# Patient Record
Sex: Female | Born: 1945 | Race: White | Hispanic: No | Marital: Married | Smoking: Never smoker
Health system: Southern US, Community
[De-identification: ages and names within clinical notes are randomized; demographics above are authoritative.]

## PROBLEM LIST (undated history)

## (undated) DIAGNOSIS — N840 Polyp of corpus uteri: Secondary | ICD-10-CM

## (undated) DIAGNOSIS — M1990 Unspecified osteoarthritis, unspecified site: Secondary | ICD-10-CM

## (undated) DIAGNOSIS — H269 Unspecified cataract: Secondary | ICD-10-CM

## (undated) DIAGNOSIS — Z87442 Personal history of urinary calculi: Secondary | ICD-10-CM

## (undated) DIAGNOSIS — I10 Essential (primary) hypertension: Secondary | ICD-10-CM

## (undated) DIAGNOSIS — K5792 Diverticulitis of intestine, part unspecified, without perforation or abscess without bleeding: Secondary | ICD-10-CM

## (undated) DIAGNOSIS — T148XXA Other injury of unspecified body region, initial encounter: Secondary | ICD-10-CM

## (undated) DIAGNOSIS — Z86018 Personal history of other benign neoplasm: Secondary | ICD-10-CM

## (undated) DIAGNOSIS — B029 Zoster without complications: Secondary | ICD-10-CM

## (undated) DIAGNOSIS — D219 Benign neoplasm of connective and other soft tissue, unspecified: Secondary | ICD-10-CM

## (undated) DIAGNOSIS — Z8619 Personal history of other infectious and parasitic diseases: Secondary | ICD-10-CM

## (undated) DIAGNOSIS — I82409 Acute embolism and thrombosis of unspecified deep veins of unspecified lower extremity: Secondary | ICD-10-CM

## (undated) DIAGNOSIS — J984 Other disorders of lung: Secondary | ICD-10-CM

## (undated) DIAGNOSIS — D249 Benign neoplasm of unspecified breast: Secondary | ICD-10-CM

## (undated) HISTORY — DX: Polyp of corpus uteri: N84.0

## (undated) HISTORY — PX: DILATION AND CURETTAGE OF UTERUS: SHX78

## (undated) HISTORY — DX: Personal history of other benign neoplasm: Z86.018

## (undated) HISTORY — DX: Other disorders of lung: J98.4

## (undated) HISTORY — DX: Unspecified osteoarthritis, unspecified site: M19.90

## (undated) HISTORY — DX: Acute embolism and thrombosis of unspecified deep veins of unspecified lower extremity: I82.409

## (undated) HISTORY — PX: CATARACT EXTRACTION W/ INTRAOCULAR LENS IMPLANT: SHX1309

## (undated) HISTORY — DX: Zoster without complications: B02.9

## (undated) HISTORY — DX: Benign neoplasm of connective and other soft tissue, unspecified: D21.9

## (undated) HISTORY — DX: Unspecified cataract: H26.9

## (undated) HISTORY — PX: OTHER SURGICAL HISTORY: SHX169

## (undated) HISTORY — DX: Benign neoplasm of unspecified breast: D24.9

## (undated) HISTORY — DX: Diverticulitis of intestine, part unspecified, without perforation or abscess without bleeding: K57.92

## (undated) HISTORY — DX: Personal history of other infectious and parasitic diseases: Z86.19

## (undated) HISTORY — DX: Other injury of unspecified body region, initial encounter: T14.8XXA

## (undated) HISTORY — PX: HAND SURGERY: SHX662

## (undated) HISTORY — DX: Essential (primary) hypertension: I10

---

## 1981-11-05 HISTORY — PX: TUBAL LIGATION: SHX77

## 1981-11-05 HISTORY — PX: CYSTECTOMY: SHX5119

## 1981-11-05 HISTORY — PX: APPENDECTOMY: SHX54

## 1988-11-05 DIAGNOSIS — Z8619 Personal history of other infectious and parasitic diseases: Secondary | ICD-10-CM

## 1988-11-05 HISTORY — DX: Personal history of other infectious and parasitic diseases: Z86.19

## 2005-11-01 ENCOUNTER — Encounter: Admission: RE | Admit: 2005-11-01 | Discharge: 2005-11-01 | Payer: Self-pay | Admitting: Radiology

## 2006-05-03 ENCOUNTER — Encounter: Admission: RE | Admit: 2006-05-03 | Discharge: 2006-05-03 | Payer: Self-pay | Admitting: Radiology

## 2006-10-16 ENCOUNTER — Encounter: Admission: RE | Admit: 2006-10-16 | Discharge: 2006-10-16 | Payer: Self-pay | Admitting: Radiology

## 2006-11-05 DIAGNOSIS — K5792 Diverticulitis of intestine, part unspecified, without perforation or abscess without bleeding: Secondary | ICD-10-CM

## 2006-11-05 HISTORY — DX: Diverticulitis of intestine, part unspecified, without perforation or abscess without bleeding: K57.92

## 2007-10-24 ENCOUNTER — Encounter: Admission: RE | Admit: 2007-10-24 | Discharge: 2007-10-24 | Payer: Self-pay | Admitting: Radiology

## 2008-03-22 ENCOUNTER — Ambulatory Visit (HOSPITAL_BASED_OUTPATIENT_CLINIC_OR_DEPARTMENT_OTHER): Admission: RE | Admit: 2008-03-22 | Discharge: 2008-03-22 | Payer: Self-pay | Admitting: Obstetrics and Gynecology

## 2008-03-22 ENCOUNTER — Encounter: Payer: Self-pay | Admitting: Obstetrics and Gynecology

## 2008-11-05 DIAGNOSIS — D249 Benign neoplasm of unspecified breast: Secondary | ICD-10-CM

## 2008-11-05 HISTORY — DX: Benign neoplasm of unspecified breast: D24.9

## 2008-11-05 HISTORY — PX: BREAST SURGERY: SHX581

## 2008-11-29 ENCOUNTER — Encounter (INDEPENDENT_AMBULATORY_CARE_PROVIDER_SITE_OTHER): Payer: Self-pay | Admitting: Surgery

## 2008-11-29 ENCOUNTER — Ambulatory Visit (HOSPITAL_BASED_OUTPATIENT_CLINIC_OR_DEPARTMENT_OTHER): Admission: RE | Admit: 2008-11-29 | Discharge: 2008-11-29 | Payer: Self-pay | Admitting: Surgery

## 2008-12-21 ENCOUNTER — Ambulatory Visit: Payer: Self-pay | Admitting: Genetic Counselor

## 2009-02-10 ENCOUNTER — Ambulatory Visit (HOSPITAL_BASED_OUTPATIENT_CLINIC_OR_DEPARTMENT_OTHER): Admission: RE | Admit: 2009-02-10 | Discharge: 2009-02-10 | Payer: Self-pay | Admitting: Surgery

## 2009-02-10 ENCOUNTER — Encounter (INDEPENDENT_AMBULATORY_CARE_PROVIDER_SITE_OTHER): Payer: Self-pay | Admitting: Surgery

## 2009-02-24 ENCOUNTER — Other Ambulatory Visit: Admission: RE | Admit: 2009-02-24 | Discharge: 2009-02-24 | Payer: Self-pay | Admitting: Obstetrics & Gynecology

## 2010-11-25 ENCOUNTER — Encounter: Payer: Self-pay | Admitting: Radiology

## 2010-12-01 ENCOUNTER — Other Ambulatory Visit: Payer: Self-pay | Admitting: Radiology

## 2010-12-01 DIAGNOSIS — Z803 Family history of malignant neoplasm of breast: Secondary | ICD-10-CM

## 2010-12-15 ENCOUNTER — Ambulatory Visit
Admission: RE | Admit: 2010-12-15 | Discharge: 2010-12-15 | Disposition: A | Discharge status: Home / Self Care | Payer: Managed Care, Other (non HMO) | Source: Ambulatory Visit | Attending: Radiology | Admitting: Radiology

## 2010-12-15 DIAGNOSIS — Z803 Family history of malignant neoplasm of breast: Secondary | ICD-10-CM

## 2010-12-15 MED ORDER — GADOBENATE DIMEGLUMINE 529 MG/ML IV SOLN
14.0000 mL | Freq: Once | INTRAVENOUS | Status: AC | PRN
  Administered 2010-12-15: 14 mL via INTRAVENOUS

## 2011-02-14 LAB — POCT HEMOGLOBIN-HEMACUE: Hemoglobin: 13.1 g/dL (ref 12.0–15.0)

## 2011-02-14 LAB — BASIC METABOLIC PANEL
BUN: 9 mg/dL (ref 6–23)
Calcium: 9.2 mg/dL (ref 8.4–10.5)
Chloride: 102 mEq/L (ref 96–112)
Creatinine, Ser: 0.84 mg/dL (ref 0.4–1.2)
GFR calc Af Amer: >60 mL/min (ref >60)
GFR calc non Af Amer: >60 mL/min (ref >60)

## 2011-02-19 LAB — BASIC METABOLIC PANEL
BUN: 14 mg/dL (ref 6–23)
Calcium: 9.5 mg/dL (ref 8.4–10.5)
Chloride: 104 mEq/L (ref 96–112)
Creatinine, Ser: 0.84 mg/dL (ref 0.4–1.2)
GFR calc Af Amer: >60 mL/min (ref >60)
GFR calc non Af Amer: >60 mL/min (ref >60)

## 2011-02-19 LAB — POCT HEMOGLOBIN-HEMACUE: Hemoglobin: 15 g/dL (ref 12.0–15.0)

## 2011-03-20 NOTE — Op Note (Signed)
NAME:  Marie Norris, Marie Norris                 ACCOUNT NO.:  [id number]   MEDICAL RECORD NO.:  [id number]          PATIENT TYPE:  AMB   LOCATION:  [facility]                         FACILITY:  [facility]   PHYSICIAN:  Cynthia P. Romine, M.D.DATE OF BIRTH:  22-Jun-1946   DATE OF PROCEDURE:  03/22/2008  DATE OF DISCHARGE:                               OPERATIVE REPORT   PREOPERATIVE DIAGNOSIS:  Postmenopausal bleeding, known endometrial  polyp on sonohysterogram.   POSTOPERATIVE DIAGNOSIS:  Postmenopausal bleeding, known endometrial  polyp on sonohysterogram,. pathology pending.   PROCEDURE:  Hysteroscopic resection of endometrial polyp, D&C.   SURGEON:  Dr. Arline Asp Romine.   ANESTHESIA:  General by LMA.   ESTIMATED BLOOD LOSS:  Minimal.   COMPLICATIONS:  None.   DESCRIPTION OF PROCEDURE:  The patient was taken to the operating room  and, after induction of adequate general anesthesia by LMA, was prepped  and draped in the usual fashion and the bladder drained with a red  rubber catheter.  A posterior weighted and anterior Sims retractor were  placed.  The cervix was grasped on its anterior lip with a single-  toothed tenaculum.  The cervix was then dilated to #31 Shawnie Pons.  The  operative hysteroscope was introduced.  Sorbitol was used as the  distention medium.  Hysteroscopy revealed there to be a long thin polyp  emanating from the fundus as was seen on sonohysterogram.  Single loop  cautery was used to remove the polyp which came out in a single piece.  Hysteroscopy after resection of the polyp revealed the cavity to be  otherwise clean and the polyp to be completely remove.  The hysteroscope  was removed.  Gentle sharp curettage was carried out, and the specimen  was sent to pathology.  Paracervical block was then instituted for  postoperative pain relief with 10 mL of 1% Xylocaine at each of 3 and 9  o'clock.  The instruments were removed from the vagina and the procedure  was terminated.  Sponge  and instrument counts were correct x3.      Cynthia P. Romine, M.D.  Electronically Signed     CPR/MEDQ  D:  03/22/2008  T:  03/22/2008  Job:  [id number]   cc:   Aram Beecham P. Romine, M.D.  Fax: [phone]

## 2011-03-20 NOTE — Op Note (Signed)
NAMEAYALA, RIBBLE                 ACCOUNT NO.:  [id number]   MEDICAL RECORD NO.:  [id number]          PATIENT TYPE:  AMB   LOCATION:  [facility]                          FACILITY:  [facility]   PHYSICIAN:  Thornton Park. Daphine Deutscher, MD  DATE OF BIRTH:  [birth year]   DATE OF PROCEDURE:  11/29/2008  DATE OF DISCHARGE:                               OPERATIVE REPORT   PREOPERATIVE DIAGNOSIS:  Left breast discharge.   POSTOPERATIVE DIAGNOSIS:  Left breast discharge.   PROCEDURE:  Cannulation of left breast ductule, loops magnification-  assisted left breast biopsy excising the ductule complex in the left  retroareolar region.   SURGEON:  Thornton Park. Daphine Deutscher, MD   ANESTHESIA:  General by LMA.   DESCRIPTION OF PROCEDURE:  Marie Norris is a 66 year old lady with  above-mentioned problem.  She was taken to room 1 and the breast was  prepped with Betadine and draped sterilely.  Previously, we had marked  the correct breast.  Both she and I had mapped the nipple in the office  where the slightly to the medial inferior center point of the nipple,  the duct that appeared to be draining the material.  With a probe that I  have passed into that duct, I went ahead and made an infra-areolar  incision and then moved up beneath the nipple.  I had to sacrifice a  couple of ducts to get to the duct in question, then removed the complex  of those ductules in the retroareolar region.  After I transected the  ducts from the nipple complex up past, other probes into the use a  larger sacs and resected this in toto.  I sent this for permanent  section.  The depths of the biopsy was a little areas a little more firm  I went ahead and sampled that and sent that as a second specimen which  was actually inferomedial to the biopsy site.  The area was irrigated  and bleeding was controlled with electrocautery.  Nothing appeared to be  bleeding and I approximated it deeply with a 4-0 Vicryl and then  subcutaneously and  subcuticularly with 4-0 Vicryl with Benzoin and Steri-  Strips on the skin.  The patient seemed tolerated the procedure well.  She was taken to recovery room in satisfactory condition.  She would be  given Tylox (30) for pain.  She should use ice packs and we will see her  back in the office in 1-2 weeks.      Thornton Park Daphine Deutscher, MD  Electronically Signed     MBM/MEDQ  D:  11/29/2008  T:  11/29/2008  Job:  [id number]   cc:   [employer]. Yolanda Bonine, M.D.

## 2011-03-20 NOTE — Op Note (Signed)
Marie Norris, Marie Norris                 ACCOUNT NO.:  [id number]   MEDICAL RECORD NO.:  [id number]          PATIENT TYPE:  AMB   LOCATION:  [facility]                          FACILITY:  [facility]   PHYSICIAN:  Thornton Park. Daphine Deutscher, MD  DATE OF BIRTH:  26-Mar-1946   DATE OF PROCEDURE:  DATE OF DISCHARGE:                               OPERATIVE REPORT   PREOPERATIVE DIAGNOSIS:  Previous breast biopsy on November 29, 2008,  revealed the deep margin showing a radial scar with some ductal  epithelial hyperplasia.  The patient has as strong family history of  breast cancer and cancer of the colon, and despite a BRCA study which  was negative, she wanted to go back to have more conservative biopsies.   PROCEDURE:  Left breast biopsy with 4 discrete biopsies taken radially  from the 9 o'clock position over to about 4 o'clock position beneath the  left nipple complex.   SURGEON:  Thornton Park. Daphine Deutscher, MD   ANESTHESIA:  General by LMA.   DESCRIPTION OF PROCEDURE:  Marie Norris was taken to room 6 on February 10, 2009, and given general anesthesia.  The breast was prepped with  chlorhexidine equivalent and draped sterilely.  I excised her previous  infra-areolar incision.  I then elevated the left nipple and stayed down  in a plane well below it.  I encountered some of the previous scar, I  used that as a guide and then went down and palpated anything that felt  little bit unusual.  As I worked over in the 4 o'clock position which  had been the lateral margin, I found a sort of old cystic structures  that appeared to contain some old blood and excised those.  Anything  that felt firm or anyway potentially suspicious, I biopsied, and again a  total of 4 biopsies were sent discretely and enumerated by the clock.  When these were complete, everything was relatively smooth.  I  cauterized the depth of the biopsy site with Bovie, injected it with  some 0.5% Marcaine.  The wound was then closed in layers of 4-0 and 5-0  Vicryls with Benzoin and Steri-Strips on the skin.  The patient  tolerated the procedure well.  This was done with a latex-free setup  because of some potential allergy on her part.      Thornton Park Daphine Deutscher, MD  Electronically Signed     MBM/MEDQ  D:  02/10/2009  T:  02/10/2009  Job:  [id number]   cc:   Marie Norris  Marie Ruths, MD

## 2011-08-01 LAB — DIFFERENTIAL
Basophils Relative: 0
Eosinophils Absolute: 0.1
Lymphs Abs: 2.4
Monocytes Relative: 6
Neutro Abs: 3.7
Neutrophils Relative %: 56

## 2011-08-01 LAB — CBC
MCV: 89.4
Platelets: 203
RBC: 4.64
WBC: 6.6

## 2011-08-01 LAB — BASIC METABOLIC PANEL
BUN: 11
Calcium: 9.5
Chloride: 101
Creatinine, Ser: 0.85
GFR calc Af Amer: >60

## 2012-03-07 LAB — HM PAP SMEAR: HM Pap smear: NEGATIVE

## 2012-11-17 LAB — HM MAMMOGRAPHY: HM Mammogram: 0

## 2013-01-03 DIAGNOSIS — H269 Unspecified cataract: Secondary | ICD-10-CM

## 2013-01-03 HISTORY — DX: Unspecified cataract: H26.9

## 2013-03-12 ENCOUNTER — Encounter: Payer: Self-pay | Admitting: Certified Nurse Midwife

## 2013-03-13 ENCOUNTER — Ambulatory Visit (INDEPENDENT_AMBULATORY_CARE_PROVIDER_SITE_OTHER): Admitting: Certified Nurse Midwife

## 2013-03-13 ENCOUNTER — Encounter: Payer: Self-pay | Admitting: Certified Nurse Midwife

## 2013-03-13 VITALS — BP 110/62 | Ht 60.25 in | Wt 151.0 lb

## 2013-03-13 DIAGNOSIS — Z01419 Encounter for gynecological examination (general) (routine) without abnormal findings: Secondary | ICD-10-CM

## 2013-03-13 DIAGNOSIS — N952 Postmenopausal atrophic vaginitis: Secondary | ICD-10-CM

## 2013-03-13 NOTE — Progress Notes (Addendum)
67 y.o. G64P2002 Married Caucasian Fe here for annual exam. Menopausal no vaginal bleeding, using Premarin cream 3 x monthly for dryness, works well for her. Spouse has ED, so sexually active less(no issues). Sad due to mom's death, and 59 year old  Granddaughter with Chron's is living with her, indefinite time frame. Hypertension stable on medication. Continues Metformin for glucose control, doing well.  Sees PCP every 6 months.  Under follow up with mammogram due to previous history of adenoma.  No new health issues today,     Patient's last menstrual period was 06/18/2004.          Sexually active: yes  The current method of family planning is tubal ligation.    Exercising: no  exercise Smoker:  no  Health Maintenance: Pap:  03-07-12 neg HPV HR neg MMG:  11-17-12 Colonoscopy:  2013 BMD:   2008 TDaP:  3/09  Labs: none   reports that she has never smoked. She has never used smokeless tobacco. She reports that she does not drink alcohol or use illicit drugs.  Past Medical History  Diagnosis Date  . Hypertension   . DVT (deep venous thrombosis)     oral contraceptive use  . History of uterine fibroid   . Diverticulitis 2008  . History of [place] spotted fever 1990  . Kidney stones 1974  . Breast adenoma 2010    benign  . 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 Torn ligament     left ankle    Past Surgical History  Procedure Laterality Date  . Tubal ligation Bilateral 1983  . Appendectomy  1983  . Cystectomy Right 1983    right ovary  . Breast surgery Left 2010    adenoma removal, biopsy, tissue removal; benign  . Polyp removal      Current Outpatient Prescriptions  Medication Sig Dispense Refill  . azelastine (OPTIVAR) 0.05 % ophthalmic solution 1 drop 2 (two) times daily as needed.      . Calcium Carbonate-Vitamin D (CALCIUM 600 + D PO) Take by mouth daily.       Marland Kitchen conjugated estrogens (PREMARIN) vaginal cream Place vaginally as needed.        . diclofenac sodium (VOLTAREN) 1 % GEL Apply topically daily.       . fluticasone (FLONASE) 50 MCG/ACT nasal spray Place 2 sprays into the nose daily as needed for rhinitis.      . furosemide (LASIX) 20 MG tablet Take 20 mg by mouth daily.      Marland Kitchen ibuprofen (ADVIL,MOTRIN) 200 MG tablet Take 200 mg by mouth every 6 (six) hours as needed for pain.      Marland Kitchen losartan (COZAAR) 100 MG tablet Take 100 mg by mouth daily.      . metFORMIN (GLUCOPHAGE) 500 MG tablet Take 500 mg by mouth daily.       . Misc Natural Products (OSTEO BI-FLEX JOINT SHIELD PO) Take by mouth daily. With glucosamine & vitamin d3      . Multiple Vitamin (MULTIVITAMIN) tablet Take 1 tablet by mouth daily.      . OMEGA 3 1200 MG CAPS Take by mouth 2 (two) times daily.      . Red Yeast Rice 600 MG TABS Take by mouth daily.       No current facility-administered medications for this visit.    Family History  Problem Relation Age of Onset  . COPD Mother     stage  4  . Hypertension Mother 42  . Mitral valve prolapse Mother 43  . Crohn's disease Grandchild     granddaughter  . Mitral valve prolapse Sister 30  . Breast cancer Sister   . Colon cancer Father   . Cancer Paternal Aunt     bladder  . Evelene Croon Parkinson White syndrome Paternal Uncle   . Breast cancer Maternal Grandmother   . Evelene Croon Parkinson White syndrome Paternal Grandfather     ?  Marland Kitchen Evelene Croon Parkinson White syndrome Cousin   . Evelene Croon Parkinson White syndrome Cousin   . Cancer Paternal Aunt     melanoma  . Cancer Paternal Uncle     blood cancer    ROS:  Pertinent items are noted in HPI.  Otherwise, a comprehensive ROS was negative.  Exam:   BP 110/62  Ht 5' 0.25" (1.53 m)  Wt 151 lb (68.493 kg)  BMI 29.26 kg/m2  LMP 06/18/2004 Height: 5' 0.25" (153 cm)  Ht Readings from Last 3 Encounters:  03/13/13 5' 0.25" (1.53 m)    General appearance: alert, cooperative and appears stated age, crying sharing mother passing with family history change Head:  Normocephalic, without obvious abnormality, atraumatic Neck: no adenopathy, supple, symmetrical, trachea midline and thyroid normal to inspection and palpation Lungs: clear to auscultation bilaterally Breasts: normal appearance, no masses or tenderness, No nipple retraction or dimpling, No nipple discharge or bleeding, No axillary or supraclavicular adenopathy Heart: regular rate and rhythm Abdomen: soft, non-tender; no masses,  no organomegaly Extremities: extremities normal, atraumatic, no cyanosis or edema Skin: Skin color, texture, turgor normal. No rashes or lesions Lymph nodes: Cervical, supraclavicular, and axillary nodes normal. No abnormal inguinal nodes palpated Neurologic: Grossly normal   Pelvic: External genitalia:  no lesions              Urethra:  normal appearing urethra with no masses, tenderness or lesions              Bartholin's and Skene's: normal                 Vagina:atrophic appearing vagina with normal color and discharge, no lesions, moist              Cervix: normal, non tender              Pap taken: no Bimanual Exam:  Uterus:  normal size, contour, position, consistency, mobility, non-tender and mid position              Adnexa: normal adnexa and no mass, fullness, tenderness               Rectovaginal: Confirms               Anus:  normal sphincter tone, no lesions  A:  Well Woman with normal exam  Menopausal, no HRT  Atrophic vaginitis uses Premarin cream with good success  Hypertension stable on medication  Diabetes stable on Metformin  Social Stress with family  P:  Reviewed health and wellness pertinent to exam   Pap smear as per guidelines   Mammogram as recommended with follow up  Rx Premarin Cream see order  Pap smear not taken today counseled on mammography screening, adequate intake of calcium and vitamin D, diet and exercise return annually or prn  Continue MD follow up with medical issues  Discussed time for self with family issues,  seek friends and church support. Discuss with PCP if feels she needs help with anti depressive medication.  Patient prefers PCP to work with if needed. Offered my sympathy in loss of Mom.  Rv annually or prn  An After Visit Summary was printed and given to the patient. Reviewed, TL

## 2013-03-13 NOTE — Patient Instructions (Signed)

## 2013-03-23 ENCOUNTER — Telehealth: Payer: Self-pay | Admitting: Certified Nurse Midwife

## 2013-03-23 NOTE — Telephone Encounter (Signed)
Routed to triage 

## 2013-03-23 NOTE — Telephone Encounter (Signed)
Patient requests a written prescription, but wants to speak to nurse more in depth about her questions pertaining.

## 2013-03-26 ENCOUNTER — Telehealth: Payer: Self-pay | Admitting: Certified Nurse Midwife

## 2013-03-26 MED ORDER — ESTROGENS, CONJUGATED 0.625 MG/GM VA CREA: TOPICAL_CREAM | VAGINAL | Status: DC | PRN

## 2013-03-26 NOTE — Telephone Encounter (Signed)
Patient request a prescription mailed to her home for premarin. Patient will mail to her mail order pharmacy. Please call patient first.

## 2013-03-26 NOTE — Telephone Encounter (Signed)
Patient seen 03/13/13 and was not given her script for Premarin ok to fill for one year? She is asking it be mailed to her.

## 2013-03-26 NOTE — Addendum Note (Signed)
Addended by: Verner Chol on: 03/26/2013 04:01 PM   Modules accepted: Orders

## 2013-03-26 NOTE — Telephone Encounter (Signed)
Patient needs a written premarin prescription mailed to her home. Patient will mail to her mail order pharmacy. Please call patient when this mailed out to her.

## 2013-03-27 NOTE — Telephone Encounter (Signed)
Pt wants to speak with Amy in regards to written consent for premarin prescription.

## 2013-03-27 NOTE — Telephone Encounter (Signed)
Patient reports she has [facility] and they do not have electronic service. Requests hard copy be mailed to her.

## 2013-03-27 NOTE — Telephone Encounter (Signed)
LMTCB about mail order pharmacy. Advised we can send in Rx electronically and save her the trouble.

## 2013-05-14 ENCOUNTER — Encounter: Payer: Self-pay | Admitting: Certified Nurse Midwife

## 2013-05-14 ENCOUNTER — Ambulatory Visit (INDEPENDENT_AMBULATORY_CARE_PROVIDER_SITE_OTHER): Admitting: Certified Nurse Midwife

## 2013-05-14 VITALS — BP 108/62 | HR 68 | Resp 16 | Ht 60.0 in | Wt 151.0 lb

## 2013-05-14 DIAGNOSIS — N950 Postmenopausal bleeding: Secondary | ICD-10-CM

## 2013-05-14 DIAGNOSIS — R9389 Abnormal findings on diagnostic imaging of other specified body structures: Secondary | ICD-10-CM

## 2013-05-14 DIAGNOSIS — D259 Leiomyoma of uterus, unspecified: Secondary | ICD-10-CM

## 2013-05-14 NOTE — Progress Notes (Signed)
67 yo married white female G2 P2 here complaining of post-menopausal bleeding in the past 24 hours. Felt inside vagina to cervix and noted pink to red discharge from cervix. Started with brown discharge yesterday and continued to occur, noting on panties and then continued to notice red - pink until late pm. None noted this am. History of fibroid 1983, endometrial polyp with hysteroscopic resection polyp , D&C with Dr. Tresa Res 2009. No sexually activity in the past two weeks. Denies vaginal itching, burning or discharge or vaginal dryness. Denies pelvic or abdominal pain. Also being worked up for 3rd finger on right hand for surgery.Recent aex 03/07/13 normal, last pap was 03/07/12 negative with negative HPVHR. Patient has surgery for hand scheduled on July 29,2014.   O: Healthy female WDWN Affect: normal orientation x 3  Exam: Skin: warm and dry Abdomen: soft, non tender Pelvic: External genital area: no lesions BUS: negative Vagina: moist normal vaginal discharge noted, no blood noted Cervix:parous, non tender, with pink discharge from cervix noted x 2, no lesions or polyp type tissue noted in visual area of cervix, negative CMT Uterus:Mid position, non tender, normal size Adnexa: normal, non tender, no masses palpated Perineal area: no lesions  A:Post menopausal bleeding, history of endometrial polyp with hysteroscopic resection and D&C 2009. 2-Right hand 3rd finger surgery scheduled 06-02-13  P: Discussed need for evaluation and noted coming from cervix not vagina. Discussed evaluation with PUS, ? SHGM, endometrial biopsy. Patient agreeable to any evaluation needed. Instructed to call if occurs again before evaluated. Patient agreeable. Will consult with MD 2-Patient would like evaluation before finger surgery if possible.  Rv prn and as above  Reviewed, TL

## 2013-05-21 ENCOUNTER — Telehealth: Payer: Self-pay | Admitting: Certified Nurse Midwife

## 2013-05-21 NOTE — Telephone Encounter (Signed)
Patient calling re: still waiting to hear about benefits to schedule ultrasound. Patient seemed anxious.

## 2013-05-26 NOTE — Telephone Encounter (Signed)
LMTCB on cell number to discuss insurance benefits for pus-shgm/endo bx.  LMTCB on home number to to discuss insurance benefits.

## 2013-05-28 ENCOUNTER — Other Ambulatory Visit: Payer: Self-pay | Admitting: Gynecology

## 2013-05-28 DIAGNOSIS — N950 Postmenopausal bleeding: Secondary | ICD-10-CM

## 2013-06-09 ENCOUNTER — Encounter: Payer: Self-pay | Admitting: Gynecology

## 2013-06-09 ENCOUNTER — Ambulatory Visit (INDEPENDENT_AMBULATORY_CARE_PROVIDER_SITE_OTHER): Admitting: Gynecology

## 2013-06-09 ENCOUNTER — Ambulatory Visit (INDEPENDENT_AMBULATORY_CARE_PROVIDER_SITE_OTHER)

## 2013-06-09 DIAGNOSIS — N950 Postmenopausal bleeding: Secondary | ICD-10-CM

## 2013-06-09 DIAGNOSIS — R9389 Abnormal findings on diagnostic imaging of other specified body structures: Secondary | ICD-10-CM

## 2013-06-09 DIAGNOSIS — D259 Leiomyoma of uterus, unspecified: Secondary | ICD-10-CM

## 2013-06-09 DIAGNOSIS — I10 Essential (primary) hypertension: Secondary | ICD-10-CM

## 2013-06-09 DIAGNOSIS — Z86718 Personal history of other venous thrombosis and embolism: Secondary | ICD-10-CM | POA: Insufficient documentation

## 2013-06-09 MED ORDER — MISOPROSTOL 200 MCG PO TABS: ORAL_TABLET | ORAL | Status: DC

## 2013-06-09 NOTE — Addendum Note (Signed)
Addended by: Lorraine Lax on: 06/09/2013 05:22 PM   Modules accepted: Orders

## 2013-06-09 NOTE — Progress Notes (Signed)
      Pt is here for u/s with SHG due to PMB that occurred 7/10.  Pt reports that she only bled one day after that encounter.  Pt is without concenrs today. U/S images were reviewed with the patient, she was noted to have an asymmetry in the endometrium. Stripe 2.2mm, ovaries consistent with menopausal changes.  Pt  Had had a D&C hysteroscopy in 2009 for an endometrial polyp. A sonohysterogram was performed.  A graves speculum was placed, the cervix was visualized and cleansed with Hibiclens. The insemination catheter was placed and walls gently distended.  A left cornual defect was noted. Images today were compared with the u/s images of 2009, similar location was noted. We recommend proceeding with a D&C Hysteroscopy to remove this cornual mass.    Pt informed regarding risks and benefits of surgery, including but not limited to bleeding, infections, damage to bowel or bladder due to uterine perforation either during the procedure or during dilation.  Cytotec will be given preoperatively to soften the cervix.    There is a risk of formation of a deep vein thrombus in the extremities was discussed, although PAS will be placed to minimize the risk, the patient was informed that a pulmonary embolism could still form and could result in death.  Pt had had a DVT on ocp in the past after her 2 pregnancies, of which she had had no complications, in addition she had had other surgeries for breast, ankle and hysteroscopy.  We will see if she had any evaluation for factor V or other coagulopathy, and determine if additional evaluation is warranted before proceeding She was instructed on signs and symptoms to be aware of and the need to call if they should develop.  Fluid overload from the distending media was discussed as was the usual intra-operative safety precautions in place.  All questions were addressed.    Length of additional time spent discussing treatment of PMB and DVT 40m 

## 2013-06-10 ENCOUNTER — Telehealth: Payer: Self-pay | Admitting: Gynecology

## 2013-06-10 NOTE — Telephone Encounter (Signed)
LMTCB on cell  to discuss ins benefits for surgery.

## 2013-06-11 NOTE — Telephone Encounter (Signed)
Pt returning call

## 2013-06-12 ENCOUNTER — Telehealth: Payer: Self-pay | Admitting: *Deleted

## 2013-06-12 NOTE — Telephone Encounter (Signed)
Patient calling you back about her surgery

## 2013-06-12 NOTE — Telephone Encounter (Signed)
Call to patient to check on date preferences for surgery. Female states patient not available, LMTCB, nothing wrong.

## 2013-06-15 ENCOUNTER — Telehealth: Payer: Self-pay | Admitting: Certified Nurse Midwife

## 2013-06-15 NOTE — Telephone Encounter (Signed)
Patient came by to make payment on surgery . Wanted to speak with you while she was here . Said she missed your call on Friday. She left word that when you called her back to call her after 1:30 that she would not be home until then.

## 2013-06-17 ENCOUNTER — Encounter (HOSPITAL_COMMUNITY): Payer: Self-pay | Admitting: Pharmacy Technician

## 2013-06-17 NOTE — Telephone Encounter (Signed)
LM on VM, per patient previous instructions.  Surgery scheduled for 06-24-13 and to call back for instructions, post op and to see if any previous labs done for previous DVT?

## 2013-06-18 ENCOUNTER — Encounter (HOSPITAL_COMMUNITY): Payer: Self-pay | Admitting: Pharmacy Technician

## 2013-06-18 NOTE — Telephone Encounter (Signed)
Call to patient, female states she "is outside". LMTCB

## 2013-06-18 NOTE — Telephone Encounter (Signed)
Call back to patient and notified that surgery is scheduled for 06-24-13 at 9 am at [facility]. Instructuions reviewed and mailed. Post op scheduled. Patient with two questions.  1) she really does not think any labs were done about previous DVT.  Dr Modesto Charon would have done them but patient feels certain not done. Should we do now?  2) has been off premarin cream since episode of bleeding.  She is feeling the discomfort from dryness, should she use cream tonight to improve this before surgery?

## 2013-06-18 NOTE — Telephone Encounter (Signed)
I went thru everything in Epic and I cannot find any lab, I  Will contact her PCP and OR in am

## 2013-06-19 NOTE — Telephone Encounter (Signed)
Patient notified that per Dr Farrel Gobble, she has consulted with Dr Myna Hidalgo Hematology and patients PCP Dr Martha Clan.  At this time, no other testing indicated prior to surgery.

## 2013-06-23 ENCOUNTER — Encounter (HOSPITAL_COMMUNITY): Payer: Self-pay

## 2013-06-23 ENCOUNTER — Other Ambulatory Visit: Payer: Self-pay

## 2013-06-23 ENCOUNTER — Encounter (HOSPITAL_COMMUNITY)
Admission: RE | Admit: 2013-06-23 | Discharge: 2013-06-23 | Disposition: A | Discharge status: Home / Self Care | Source: Ambulatory Visit | Attending: Gynecology | Admitting: Gynecology

## 2013-06-23 HISTORY — DX: Personal history of urinary calculi: Z87.442

## 2013-06-23 LAB — CBC
HCT: 42.9 % (ref 36.0–46.0)
MCHC: 35 g/dL (ref 30.0–36.0)
MCV: 89 fL (ref 78.0–100.0)
RDW: 13.1 % (ref 11.5–15.5)
WBC: 7.7 10*3/uL (ref 4.0–10.5)

## 2013-06-23 LAB — BASIC METABOLIC PANEL
BUN: 12 mg/dL (ref 6–23)
Chloride: 99 mEq/L (ref 96–112)
Creatinine, Ser: 0.91 mg/dL (ref 0.50–1.10)
GFR calc Af Amer: 76 mL/min — ABNORMAL LOW (ref >90)

## 2013-06-23 NOTE — Patient Instructions (Signed)
Your procedure is scheduled on: 06/24/13  Enter through the Main Entrance at :0730 Pick up desk phone and dial [phone] and inform us of your arrival.  Please call [phone] if you have any problems the morning of surgery.  Remember: Do not eat food or drink liquids, including water, after midnight:tonight   You may brush your teeth the morning of surgery.  Take these meds the morning of surgery with a sip of water: none-  HOLD METFORMIN FOR 24 HRS PRIOR TO SURGERY  DO NOT wear jewelry, eye make-up, lipstick,body lotion, or dark fingernail polish.  (Polished toes are ok) You may wear deodorant.  If you are to be admitted after surgery, leave suitcase in car until your room has been assigned. Patients discharged on the day of surgery will not be allowed to drive home. Wear loose fitting, comfortable clothes for your ride home.

## 2013-06-24 ENCOUNTER — Encounter (HOSPITAL_COMMUNITY)
Admission: RE | Disposition: A | Discharge status: Home / Self Care | Payer: Self-pay | Source: Ambulatory Visit | Attending: Gynecology

## 2013-06-24 ENCOUNTER — Encounter (HOSPITAL_COMMUNITY): Payer: Self-pay | Admitting: Registered Nurse

## 2013-06-24 ENCOUNTER — Ambulatory Visit (HOSPITAL_COMMUNITY)
Admission: RE | Admit: 2013-06-24 | Discharge: 2013-06-24 | Disposition: A | Discharge status: Home / Self Care | Source: Ambulatory Visit | Attending: Gynecology | Admitting: Gynecology

## 2013-06-24 ENCOUNTER — Ambulatory Visit (HOSPITAL_COMMUNITY): Admitting: Registered Nurse

## 2013-06-24 ENCOUNTER — Encounter (HOSPITAL_COMMUNITY): Payer: Self-pay | Admitting: *Deleted

## 2013-06-24 DIAGNOSIS — N950 Postmenopausal bleeding: Secondary | ICD-10-CM

## 2013-06-24 DIAGNOSIS — I10 Essential (primary) hypertension: Secondary | ICD-10-CM

## 2013-06-24 HISTORY — PX: DILATATION & CURETTAGE/HYSTEROSCOPY WITH TRUECLEAR: SHX6353

## 2013-06-24 SURGERY — DILATATION & CURETTAGE/HYSTEROSCOPY WITH TRUCLEAR
Anesthesia: General | Site: Uterus | Wound class: Clean Contaminated

## 2013-06-24 MED ORDER — BUPIVACAINE-EPINEPHRINE PF 0.25-1:200000 % IJ SOLN
INTRAMUSCULAR | Status: AC
  Filled 2013-06-24: qty 30

## 2013-06-24 MED ORDER — FENTANYL CITRATE 0.05 MG/ML IJ SOLN
INTRAMUSCULAR | Status: DC | PRN
  Administered 2013-06-24 (×2): 50 ug via INTRAVENOUS

## 2013-06-24 MED ORDER — LACTATED RINGERS IV SOLN
INTRAVENOUS | Status: DC
  Administered 2013-06-24 (×2): via INTRAVENOUS

## 2013-06-24 MED ORDER — LACTATED RINGERS IV SOLN: INTRAVENOUS | Status: DC

## 2013-06-24 MED ORDER — DEXAMETHASONE SODIUM PHOSPHATE 10 MG/ML IJ SOLN
INTRAMUSCULAR | Status: AC
  Filled 2013-06-24: qty 1

## 2013-06-24 MED ORDER — EPHEDRINE SULFATE 50 MG/ML IJ SOLN
INTRAMUSCULAR | Status: DC | PRN
  Administered 2013-06-24 (×2): 10 mg via INTRAVENOUS

## 2013-06-24 MED ORDER — ONDANSETRON HCL 4 MG/2ML IJ SOLN: 4.0000 mg | Freq: Once | INTRAMUSCULAR | Status: DC | PRN

## 2013-06-24 MED ORDER — ONDANSETRON HCL 4 MG/2ML IJ SOLN
INTRAMUSCULAR | Status: DC | PRN
  Administered 2013-06-24: 4 mg via INTRAVENOUS

## 2013-06-24 MED ORDER — PROPOFOL 10 MG/ML IV EMUL
INTRAVENOUS | Status: AC
  Filled 2013-06-24: qty 20

## 2013-06-24 MED ORDER — FENTANYL CITRATE 0.05 MG/ML IJ SOLN: 25.0000 ug | INTRAMUSCULAR | Status: DC | PRN

## 2013-06-24 MED ORDER — SODIUM CHLORIDE 0.9 % IR SOLN
Status: DC | PRN
  Administered 2013-06-24: 3000 mL via INTRAVESICAL

## 2013-06-24 MED ORDER — PROPOFOL 10 MG/ML IV BOLUS
INTRAVENOUS | Status: DC | PRN
  Administered 2013-06-24: 170 mg via INTRAVENOUS

## 2013-06-24 MED ORDER — LIDOCAINE HCL (CARDIAC) 20 MG/ML IV SOLN
INTRAVENOUS | Status: AC
  Filled 2013-06-24: qty 5

## 2013-06-24 MED ORDER — KETOROLAC TROMETHAMINE 30 MG/ML IJ SOLN
15.0000 mg | Freq: Once | INTRAMUSCULAR | Status: DC | PRN
Start: 2013-06-24 — End: 2013-06-24

## 2013-06-24 MED ORDER — MEPERIDINE HCL 25 MG/ML IJ SOLN: 6.2500 mg | INTRAMUSCULAR | Status: DC | PRN

## 2013-06-24 MED ORDER — LIDOCAINE HCL 2 % IJ SOLN
INTRAMUSCULAR | Status: DC | PRN
  Administered 2013-06-24: 15 mL

## 2013-06-24 MED ORDER — ONDANSETRON HCL 4 MG/2ML IJ SOLN
INTRAMUSCULAR | Status: AC
  Filled 2013-06-24: qty 2

## 2013-06-24 MED ORDER — EPHEDRINE 5 MG/ML INJ
INTRAVENOUS | Status: AC
  Filled 2013-06-24: qty 10

## 2013-06-24 MED ORDER — MIDAZOLAM HCL 5 MG/5ML IJ SOLN
INTRAMUSCULAR | Status: DC | PRN
  Administered 2013-06-24: 2 mg via INTRAVENOUS

## 2013-06-24 MED ORDER — DEXAMETHASONE SODIUM PHOSPHATE 10 MG/ML IJ SOLN
INTRAMUSCULAR | Status: DC | PRN
  Administered 2013-06-24: 10 mg via INTRAVENOUS

## 2013-06-24 MED ORDER — MIDAZOLAM HCL 2 MG/2ML IJ SOLN
INTRAMUSCULAR | Status: AC
  Filled 2013-06-24: qty 2

## 2013-06-24 MED ORDER — LIDOCAINE HCL (CARDIAC) 20 MG/ML IV SOLN
INTRAVENOUS | Status: DC | PRN
  Administered 2013-06-24: 50 mg via INTRAVENOUS

## 2013-06-24 MED ORDER — LIDOCAINE HCL 2 % IJ SOLN
INTRAMUSCULAR | Status: AC
  Filled 2013-06-24: qty 20

## 2013-06-24 MED ORDER — FENTANYL CITRATE 0.05 MG/ML IJ SOLN
INTRAMUSCULAR | Status: AC
  Filled 2013-06-24: qty 2

## 2013-06-24 SURGICAL SUPPLY — 22 items
BLADE INCISOR TRUC PLUS 2.9 (ABLATOR) ×1 IMPLANT
CANISTERS HI-FLOW 3000CC (CANNISTER) ×8 IMPLANT
CATH SILICONE 16FRX5CC (CATHETERS) ×2 IMPLANT
CONTAINER PREFILL 10% NBF 60ML (FORM) ×4 IMPLANT
DECANTER SPIKE VIAL GLASS SM (MISCELLANEOUS) ×2 IMPLANT
DRAPE HYSTEROSCOPY (DRAPE) ×2 IMPLANT
DRESSING TELFA 8X3 (GAUZE/BANDAGES/DRESSINGS) ×2 IMPLANT
GLOVE BIOGEL PI IND STRL 7.0 (GLOVE) ×2 IMPLANT
GLOVE BIOGEL PI INDICATOR 7.0 (GLOVE) ×2
GLOVE SURG SS PI 6.5 STRL IVOR (GLOVE) ×6 IMPLANT
GLOVE SURG SS PI 7.0 STRL IVOR (GLOVE) ×4 IMPLANT
GOWN STRL REIN XL XLG (GOWN DISPOSABLE) ×8 IMPLANT
INCISOR TRUC PLUS BLADE 2.9 (ABLATOR) ×2
KIT HYSTEROSCOPY TRUCLEAR (ABLATOR) ×2 IMPLANT
NEEDLE SPNL 22GX3.5 QUINCKE BK (NEEDLE) ×2 IMPLANT
PACK VAGINAL MINOR WOMEN LF (CUSTOM PROCEDURE TRAY) ×2 IMPLANT
PAD OB MATERNITY 4.3X12.25 (PERSONAL CARE ITEMS) ×4 IMPLANT
SYR BULB IRRIGATION 50ML (SYRINGE) ×2 IMPLANT
SYR CONTROL 10ML LL (SYRINGE) ×2 IMPLANT
SYRINGE 10CC LL (SYRINGE) ×2 IMPLANT
TOWEL OR 17X24 6PK STRL BLUE (TOWEL DISPOSABLE) ×6 IMPLANT
WATER STERILE IRR 1000ML POUR (IV SOLUTION) ×2 IMPLANT

## 2013-06-24 NOTE — Preoperative (Signed)
Beta Blockers   Reason not to administer Beta Blockers:Not Applicable 

## 2013-06-24 NOTE — Op Note (Signed)
[name], Marie Norris                 ACCOUNT NO.:  [id number]  MEDICAL RECORD NO.:  [id number]  LOCATION:  [facility]                          FACILITY:  [facility]  PHYSICIAN:  Ivor Costa. Farrel Gobble, M.D. DATE OF BIRTH:  12/23/1945  DATE OF PROCEDURE:  06/24/2013 DATE OF DISCHARGE:  06/24/2013                              OPERATIVE REPORT   PREOPERATIVE DIAGNOSES: 1. Postmenopausal bleeding. 2. Endometrial polyp.  POSTOPERATIVE DIAGNOSES: 1. Postmenopausal bleeding. 2. Endometrial polyp.  PROCEDURE:  Dilation and curettage hysteroscopy with TRUCLEAR.  SURGEON:  Ivor Costa. Farrel Gobble, M.D.  ANESTHESIA:  MAC.  IV FLUIDS:  A 1000 mL.  URINE OUTPUT:  200 mL.  I AND O DEFICIT OF NORMAL SALINE:  0.  INDICATIONS:  The patient is a 67 year old, postmenopausal woman, who presented for postmenopausal bleeding.  The patient was noted to have had a D and C hysteroscopy for the same indication back in 2009, where she had a polyp arising from the area of the left cornua. 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  FINDINGS:  The uterus sounded to 7.  The right cornua was visualized and unremarkable.  The left cornua was somewhat obscured with a flat sessile soft mass from the cornual area up to the mid portion anterior fundal. Remainder of the cavity was unremarkable as was the cervix.  DESCRIPTION OF PROCEDURE:  The patient was taken to the operating room, placed in the dorsal lithotomy position, prepped and draped in the usual sterile fashion.  Bimanual exam was performed.  The orientation of the uterus was confirmed.  The bivalve speculum was then placed in the vagina.  The cervix was visualized, stabilized with single-tooth tenaculum.  The uterus sounded to 7.  Cervix was felt to be dilated from the Cytotec given the night before.  A #17 dilator advanced with ease. The TRUCLEAR was then set and advanced through the cervix.  The  findings were as noted above.  The ostia on the right was crisp and able to be readily identified.  The ostia on the left seemed somewhat obscured, which was felt to be the area of this sessile mass, and was consistent with the area of her prior polyp.  The TRUCLEAR was then set into the locked position and then turned on, and in a sweeping fashion going around the ostial area and the fundal area.  The patient was able to be removed pulling the hysteroscope back, showed that the subtle defect was now no longer evident and the tubal ostia was now able to be seen. There was only minimal amount of tissue, however, that was obtained. The antrum was then removed.  A sharp curettage was performed, again for minimal tissue.  The instruments were then removed.  At the beginning of the case, the patient's cervix had been injected with 2% lidocaine for a total of 13 mL.  Lap, sponge, and needle counts were correct x2.  She was extubated in the OR, and transferred to the recovery room in stable condition.     Ivor Costa. Farrel Gobble, M.D.     THL/MEDQ  D:  06/24/2013  T:  06/24/2013  Job:  [id number]

## 2013-06-24 NOTE — H&P (View-Only) (Signed)
      Pt is here for u/s with [facility] due to PMB that occurred 7/10.  Pt reports that she only bled one day after that encounter.  Pt is without concenrs today. U/S images were reviewed with the patient, she was noted to have an asymmetry in the endometrium. Stripe 2.31mm, ovaries consistent with menopausal changes.  Pt  Had had a D&C hysteroscopy in 2009 for an endometrial polyp. A sonohysterogram was performed.  A graves speculum was placed, the cervix was visualized and cleansed with Hibiclens. The insemination catheter was placed and walls gently distended.  A left cornual defect was noted. Images today were compared with the u/s images of 2009, similar location was noted. We recommend proceeding with a D&C Hysteroscopy to remove this cornual mass.    Pt informed regarding risks and benefits of surgery, including but not limited to bleeding, infections, damage to bowel or bladder due to uterine perforation either during the procedure or during dilation.  Cytotec will be given preoperatively to soften the cervix.    There is a risk of formation of a deep vein thrombus in the extremities was discussed, although PAS will be placed to minimize the risk, the patient was informed that a pulmonary embolism could still form and could result in death.  Pt had had a DVT on ocp in the past after her 2 pregnancies, of which she had had no complications, in addition she had had other surgeries for breast, ankle and hysteroscopy.  We will see if she had any evaluation for factor V or other coagulopathy, and determine if additional evaluation is warranted before proceeding She was instructed on signs and symptoms to be aware of and the need to call if they should develop.  Fluid overload from the distending media was discussed as was the usual intra-operative safety precautions in place.  All questions were addressed.    Length of additional time spent discussing treatment of PMB and DVT 24m

## 2013-06-24 NOTE — Transfer of Care (Signed)
Immediate Anesthesia Transfer of Care Note  Patient: Marie Norris  Procedure(s) Performed: Procedure(s): DILATATION & CURETTAGE/HYSTEROSCOPY WITH TRUECLEAR (N/A)  Patient Location: PACU  Anesthesia Type:General  Level of Consciousness: sedated  Airway & Oxygen Therapy: Patient Spontanous Breathing and Patient connected to nasal cannula oxygen  Post-op Assessment: Report given to PACU RN  Post vital signs: Reviewed  Complications: No apparent anesthesia complications

## 2013-06-24 NOTE — Anesthesia Preprocedure Evaluation (Signed)
Anesthesia Evaluation  Patient identified by MRN, date of birth, ID band Patient awake    Reviewed: Allergy & Precautions, H&P , NPO status , Patient's Chart, lab work & pertinent test results  Airway Mallampati: II TM Distance: >3 FB Neck ROM: full    Dental no notable dental hx. (+) Teeth Intact   Pulmonary neg pulmonary ROS,    Pulmonary exam normal       Cardiovascular     Neuro/Psych negative neurological ROS  negative psych ROS   GI/Hepatic negative GI ROS, Neg liver ROS,   Endo/Other  negative endocrine ROS  Renal/GU negative Renal ROS     Musculoskeletal   Abdominal Normal abdominal exam  (+)   Peds  Hematology negative hematology ROS (+)   Anesthesia Other Findings   Reproductive/Obstetrics negative OB ROS                           Anesthesia Physical Anesthesia Plan  ASA: II  Anesthesia Plan: General   Post-op Pain Management:    Induction: Intravenous  Airway Management Planned: LMA  Additional Equipment:   Intra-op Plan:   Post-operative Plan:   Informed Consent: I have reviewed the patients History and Physical, chart, labs and discussed the procedure including the risks, benefits and alternatives for the proposed anesthesia with the patient or authorized representative who has indicated his/her understanding and acceptance.     Plan Discussed with: CRNA and Surgeon  Anesthesia Plan Comments:         Anesthesia Quick Evaluation

## 2013-06-24 NOTE — Interval H&P Note (Signed)
History and Physical Interval Note:  06/24/2013 8:51 AM  [name]  has presented today for surgery, with the diagnosis of Post Menopausal Bleeding  The various methods of treatment have been discussed with the patient and family. After consideration of risks, benefits and other options for treatment, the patient has consented to  Procedure(s): DILATATION & CURETTAGE/HYSTEROSCOPY WITH TRUECLEAR (N/A) as a surgical intervention .  The patient's history has been reviewed, patient examined, no change in status, stable for surgery.  I have reviewed the patient's chart and labs.  Questions were answered to the patient's satisfaction.   PE:  Alert and oriented Lungs: clear Heart: reg rate, rhythm Abd: soft nontender   Spyridon Hornstein H

## 2013-06-24 NOTE — Brief Op Note (Signed)
06/24/2013  9:55 AM  PATIENT:  Marie Norris  67 y.o. female  PRE-OPERATIVE DIAGNOSIS:  Post Menopausal Bleeding  POST-OPERATIVE DIAGNOSIS:  Post Menopausal Bleeding  PROCEDURE:  Procedure(s): DILATATION & CURETTAGE/HYSTEROSCOPY WITH TRUECLEAR (N/A)  SURGEON:  Surgeon(s) and Role:    * Bennye Alm, MD - Primary  PHYSICIAN ASSISTANT:   ASSISTANTS: none   ANESTHESIA:   MAC  EBL:  Total I/O In: 1000 [I.V.:1000] Out: 200 [Urine:200]  BLOOD ADMINISTERED:none  DRAINS: none   LOCAL MEDICATIONS USED:  LIDOCAINE 2% and Amount: 13 ml  SPECIMEN:  Source of Specimen:  ECC, endometrial polyp  DISPOSITION OF SPECIMEN:  PATHOLOGY  COUNTS:  YES  TOURNIQUET:  * No tourniquets in log *  DICTATION: .Other Dictation: Dictation Number [id number]  PLAN OF CARE: Discharge to home after PACU  PATIENT DISPOSITION:  PACU - hemodynamically stable.   Delay start of Pharmacological VTE agent (>24hrs) due to surgical blood loss or risk of bleeding: no

## 2013-06-24 NOTE — Anesthesia Postprocedure Evaluation (Signed)
Anesthesia Post Note  Patient: Marie Norris  Procedure(s) Performed: Procedure(s) (LRB): DILATATION & CURETTAGE/HYSTEROSCOPY WITH TRUECLEAR (N/A)  Anesthesia type: General  Patient location: PACU  Post pain: Pain level controlled  Post assessment: Post-op Vital signs reviewed  Last Vitals:  Filed Vitals:   06/24/13 1045  BP: 113/54  Pulse: 95  Temp:   Resp: 16    Post vital signs: Reviewed  Level of consciousness: sedated  Complications: No apparent anesthesia complications

## 2013-06-25 ENCOUNTER — Encounter (HOSPITAL_COMMUNITY): Payer: Self-pay | Admitting: Gynecology

## 2013-07-10 ENCOUNTER — Ambulatory Visit (INDEPENDENT_AMBULATORY_CARE_PROVIDER_SITE_OTHER): Payer: Medicare Other | Admitting: Gynecology

## 2013-07-10 ENCOUNTER — Encounter: Payer: Self-pay | Admitting: Gynecology

## 2013-07-10 VITALS — BP 104/60 | HR 60 | Resp 16 | Ht 60.0 in | Wt 154.0 lb

## 2013-07-10 DIAGNOSIS — N950 Postmenopausal bleeding: Secondary | ICD-10-CM

## 2013-07-10 NOTE — Progress Notes (Signed)
Subjective:     Marie Norris is a 67 y.o. female who presents to the clinic 2 weeks status post diagnostic hysteroscopy and resection of polyp for abnormal uterine bleeding and polyp. Eating a regular diet with difficulty. Bowel movements are normal. The patient is not having any pain. pt bled lightly after surgery for a few days but not since stopping.  Without complaints  The following portions of the patient's history were reviewed and updated as appropriate: allergies, current medications, past family history, past medical history, past social history, past surgical history and problem list.  Review of Systems Pertinent items are noted in HPI.    Objective:    BP 104/60  Pulse 60  Resp 16  Ht 5' (1.524 m)  Wt 154 lb (69.854 kg)  BMI 30.08 kg/m2  LMP 05/13/2013 General:  alert, cooperative and appears stated age  Abdomen:   Incision:       Pelvic exam: VULVA: normal appearing vulva with no masses, tenderness or lesions, VAGINA: normal appearing vagina with normal color and discharge, no lesions, CERVIX: normal appearing cervix without discharge or lesions, UTERUS: uterus is normal size, shape, consistency and nontender, ADNEXA: normal adnexa in size, nontender and no masses.  Assessment:    Doing well postoperatively. Operative findings again reviewed. Pathology report discussed.    Plan:    1. Continue any current medications. 2. Wound care discussed. 3. Activity restrictions: none 4. Anticipated return to work: not applicable. Pt informed at tru clear speicment without pathology but curettage done at same time was benign, cornual mass seen on initial operative phots not seen on post procedure, pt offered repeat u/s to confirm or to watch for repeat bleed, right now prefers the later but can call if decides otherwise

## 2013-07-24 ENCOUNTER — Encounter: Payer: Self-pay | Admitting: Gynecology

## 2013-09-10 ENCOUNTER — Other Ambulatory Visit: Payer: Self-pay

## 2013-11-24 NOTE — Telephone Encounter (Signed)
See next OV notes.   To provider for sign off. Encounter closed.

## 2014-03-15 ENCOUNTER — Ambulatory Visit (INDEPENDENT_AMBULATORY_CARE_PROVIDER_SITE_OTHER): Payer: Medicare Other | Admitting: Certified Nurse Midwife

## 2014-03-15 ENCOUNTER — Encounter: Payer: Self-pay | Admitting: Certified Nurse Midwife

## 2014-03-15 VITALS — BP 110/60 | HR 68 | Resp 16 | Ht 60.25 in | Wt 154.0 lb

## 2014-03-15 DIAGNOSIS — Z01419 Encounter for gynecological examination (general) (routine) without abnormal findings: Secondary | ICD-10-CM

## 2014-03-15 DIAGNOSIS — Z124 Encounter for screening for malignant neoplasm of cervix: Secondary | ICD-10-CM

## 2014-03-15 DIAGNOSIS — N952 Postmenopausal atrophic vaginitis: Secondary | ICD-10-CM

## 2014-03-15 MED ORDER — ESTROGENS, CONJUGATED 0.625 MG/GM VA CREA: TOPICAL_CREAM | VAGINAL | Status: DC | PRN

## 2014-03-15 NOTE — Progress Notes (Signed)
Reviewed personally.  M. Suzanne Bekah Igoe, MD.  

## 2014-03-15 NOTE — Progress Notes (Signed)
68 y.o. [id number] Married Caucasian Fe here for annual exam. Menopausal no HRT. Denies any vaginal bleeding or vaginal dryness with Premarin cream. Sees PCP twice yearly. Brother diagnosed with aneurysm, under treatment and new grandchild with cleft palate and feeding issues. Granddaughter still living with her who has Crohn's. Patient medications for hypertension, and glucose control all stable with PCP management. All labs, aex, with PCP. No other health issues today. Recent follow up mammogram negative.  Patient's last menstrual period was 05/13/2013.          Sexually active: yes  The current method of family planning is tubal ligation.    Exercising: yes  walking & yardwork Smoker:  no  Health Maintenance: Pap:  03-07-12 neg HPV HR neg MMG: 05-18-13 f/u 36mths, 11-20-13 u/s neg Colonoscopy:  2008 aware of need it is? due patient thinks she has had one since then will advise  BMD:   2015 normal TDaP:  2009 Labs: none Self breast exam: done monthly   reports that she has never smoked. She has never used smokeless tobacco. She reports that she does not drink alcohol or use illicit drugs.  Past Medical History  Diagnosis Date  . Hypertension   . DVT (deep venous thrombosis)     oral contraceptive use  . History of uterine fibroid   . Diverticulitis 2008  . History of [place] spotted fever 1990  . Breast adenoma 2010    benign  . 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 Torn ligament     left ankle  . Cataract 3-14  . Uterine polyp   . Fibroid   . Personal history of kidney stones     Past Surgical History  Procedure Laterality Date  . Tubal ligation Bilateral 1983  . Appendectomy  1983  . Cystectomy Right 1983    right ovary  . Breast surgery Left 2010    adenoma removal, biopsy, tissue removal; benign  . Polyp removal    . Hysteroscopic resection    . Dilation and curettage of uterus    . Vaginal delivery  1969, 1973  . Dilatation &  curettage/hysteroscopy with trueclear N/A 06/24/2013    Procedure: DILATATION & CURETTAGE/HYSTEROSCOPY WITH TRUECLEAR;  Surgeon: Azalia Bilis, MD;  Location: [facility];  Service: Gynecology;  Laterality: N/A;  . Hand surgery      Current Outpatient Prescriptions  Medication Sig Dispense Refill  . azelastine (OPTIVAR) 0.05 % ophthalmic solution Place 1 drop into both eyes 2 (two) times daily as needed (allergies).       . Calcium Carbonate-Vitamin D (CALCIUM 600 + D PO) Take 1 tablet by mouth daily.       . Cholecalciferol (VITAMIN D) 2000 UNITS CAPS Take 1 capsule by mouth daily.      Marland Kitchen conjugated estrogens (PREMARIN) vaginal cream Place vaginally as needed. 1/2 gram per vagina 2 x weekly  42.5 g  3  . fluticasone (FLONASE) 50 MCG/ACT nasal spray Place 2 sprays into the nose daily as needed for rhinitis.      . furosemide (LASIX) 20 MG tablet Take 20 mg by mouth 3 (three) times a week.       . losartan (COZAAR) 100 MG tablet Take 100 mg by mouth at bedtime.       . metFORMIN (GLUCOPHAGE) 500 MG tablet Take 500 mg by mouth daily.       . metoprolol succinate (TOPROL-XL) 25 MG 24 hr tablet Take  25 mg by mouth daily.      . Multiple Vitamin (MULTIVITAMIN) tablet Take 1 tablet by mouth daily.      . OMEGA 3 1200 MG CAPS Take 1 capsule by mouth daily.       . Red Yeast Rice Extract (RED YEAST RICE PO) Take 600 mg by mouth. With coq10, NAC & milk thistle      . UNABLE TO FIND daily. Herbal  Osteo biflex       No current facility-administered medications for this visit.    Family History  Problem Relation Age of Onset  . COPD Mother     stage 45  . Hypertension Mother 2  . Mitral valve prolapse Mother 26  . Crohn's disease Grandchild     granddaughter  . Mitral valve prolapse Sister 39  . Breast cancer Sister   . Colon cancer Father   . Cancer Paternal Aunt     bladder  . Yves Dill Parkinson White syndrome Paternal Uncle   . Breast cancer Maternal Grandmother   . [place] White  syndrome Paternal Grandfather     ?  Marland Kitchen Yves Dill Parkinson White syndrome Cousin   . Yves Dill Parkinson White syndrome Cousin   . Cancer Paternal Aunt     melanoma  . Cancer Paternal Uncle     blood cancer    ROS:  Pertinent items are noted in HPI.  Otherwise, a comprehensive ROS was negative.  Exam:   BP 110/60  Pulse 68  Resp 16  Ht 5' 0.25" (1.53 m)  Wt 154 lb (69.854 kg)  BMI 29.84 kg/m2  LMP 05/13/2013 Height: 5' 0.25" (153 cm)  Ht Readings from Last 3 Encounters:  03/15/14 5' 0.25" (1.53 m)  07/10/13 5' (1.524 m)  06/24/13 5' (1.524 m)    General appearance: alert, cooperative and appears stated age Head: Normocephalic, without obvious abnormality, atraumatic Neck: no adenopathy, supple, symmetrical, trachea midline and thyroid normal to inspection and palpation and non-palpable Lungs: clear to auscultation bilaterally Breasts: normal appearance, no masses or tenderness, No nipple retraction or dimpling, No nipple discharge or bleeding, No axillary or supraclavicular adenopathy Heart: regular rate and rhythm Abdomen: soft, non-tender; no masses,  no organomegaly Extremities: extremities normal, atraumatic, no cyanosis or edema Skin: Skin color, texture, turgor normal. No rashes or lesions Lymph nodes: Cervical, supraclavicular, and axillary nodes normal. No abnormal inguinal nodes palpated Neurologic: Grossly normal   Pelvic: External genitalia:  no lesions              Urethra:  normal appearing urethra with no masses, tenderness or lesions              Bartholin's and Skene's: normal                 Vagina: normal appearing vagina with normal color and discharge, no lesions              Cervix: normal, non tender              Pap taken: yes Bimanual Exam:  Uterus:  normal size, contour, position, consistency, mobility, non-tender and mid position              Adnexa: normal adnexa and no mass, fullness, tenderness               Rectovaginal: Confirms                Anus:  normal sphincter tone, no lesions  A:  Well Woman with normal exam  Menopausal no HRT  History of PMB with polyp removal, no further bleeding episode  Hypertension/glucose control stable with PCP management  Atrophic vaginitis improved with Premarin cream use, needs Rx   Social stress with family needs  P:   Reviewed health and wellness pertinent to exam  Aware of need to evaluate if vaginal bleeding occurs again  Continue follow up as indicated  Rx Premarin see order  Discussed seeking time for self and friends support and other family members. Patient aware of need to take care of herself.  Pap smear taken today  Mammogram yearly  counseled on breast self exam, mammography screening, adequate intake of calcium and vitamin D, diet and exercise  return annually or prn  An After Visit Summary was printed and given to the patient.

## 2014-03-15 NOTE — Patient Instructions (Signed)

## 2014-03-16 ENCOUNTER — Telehealth: Payer: Self-pay | Admitting: Certified Nurse Midwife

## 2014-03-16 LAB — IPS PAP SMEAR ONLY

## 2014-03-16 NOTE — Telephone Encounter (Signed)
Pt calling to let DL know her last colonoscopy was August 2013

## 2014-06-02 ENCOUNTER — Ambulatory Visit (INDEPENDENT_AMBULATORY_CARE_PROVIDER_SITE_OTHER): Payer: Medicare Other

## 2014-06-02 VITALS — BP 141/58 | HR 80 | Resp 18

## 2014-06-02 DIAGNOSIS — M79605 Pain in left leg: Secondary | ICD-10-CM

## 2014-06-02 DIAGNOSIS — M722 Plantar fascial fibromatosis: Secondary | ICD-10-CM

## 2014-06-02 DIAGNOSIS — M79609 Pain in unspecified limb: Secondary | ICD-10-CM

## 2014-06-02 MED ORDER — MELOXICAM 15 MG PO TABS: 15.0000 mg | ORAL_TABLET | Freq: Every day | ORAL | Status: DC

## 2014-06-02 NOTE — Progress Notes (Signed)
   Subjective:    Patient ID: Marie Norris, female    DOB: 1946-03-14, 68 y.o.   MRN: [id number]  HPI my left heel has been hurting about 6 weeks and went to the beach and was wearing sandals and going barefoot and then I was painting and climbing and bending and hurts on the bottom and hopple and stiff and hurts during the day    Review of Systems no significant systemic findings or abnormalities are identified at this time.     Objective:   Physical Exam Lower extremity objective findings as follows vascular status is intact pedal pulses palpable DP and PT +2/4 bilateral capillary refill time 3 seconds all digits epicritic and proprioceptive sensations intact and symmetric bilateral there is normal plantar response DTRs not elicited. Dermatologic the skin color pigment normal hair growth absent. Nails unremarkable. Orthopedic biomechanical exam reveals rectus foot type mild promontory changes noted clinically. X-rays reveal no inferior calcaneal spurring mild fascial thickening good alignment of the posterior middle facets and subtalar joint. Clinically findings consistent with plantar fasciitis of the medial calcaneal tubercle medial band of the plantar fascia at its insertion. Patient had plantar fasciitis a years ago treated with orthoses and uses those although intermittently. Recently she's not been wearing orthotics however does have New balance shoes which fit and contour well.       Assessment & Plan:  Assessment recurrence of plantar fasciitis/heel spur syndrome left foot fascial strapping applied at this time prescription for [facility] is given recommend followup in 2-3 weeks if no improvement maintain her use of her orthotics in stable shoe at all times no barefoot or flimsy shoes or flip-flops also recommended ice to the area reevaluate as indicated in 2-3 weeks  Harriet Masson DPM

## 2014-06-02 NOTE — Patient Instructions (Signed)
Plantar Fasciitis Plantar fasciitis is a common condition that causes foot pain. It is soreness (inflammation) of the band of tough fibrous tissue on the bottom of the foot that runs from the heel bone (calcaneus) to the ball of the foot. The cause of this soreness may be from excessive standing, poor fitting shoes, running on hard surfaces, being overweight, having an abnormal walk, or overuse (this is common in runners) of the painful foot or feet. It is also common in aerobic exercise dancers and ballet dancers. SYMPTOMS  Most people with plantar fasciitis complain of:  Severe pain in the morning on the bottom of their foot especially when taking the first steps out of bed. This pain recedes after a few minutes of walking.  Severe pain is experienced also during walking following a long period of inactivity.  Pain is worse when walking barefoot or up stairs DIAGNOSIS   Your caregiver will diagnose this condition by examining and feeling your foot.  Special tests such as X-rays of your foot, are usually not needed. PREVENTION   Consult a sports medicine professional before beginning a new exercise program.  Walking programs offer a good workout. With walking there is a lower chance of overuse injuries common to runners. There is less impact and less jarring of the joints.  Begin all new exercise programs slowly. If problems or pain develop, decrease the amount of time or distance until you are at a comfortable level.  Wear good shoes and replace them regularly.  Stretch your foot and the heel cords at the back of the ankle (Achilles tendon) both before and after exercise.  Run or exercise on even surfaces that are not hard. For example, asphalt is better than pavement.  Do not run barefoot on hard surfaces.  If using a treadmill, vary the incline.  Do not continue to workout if you have foot or joint problems. Seek professional help if they do not improve. HOME CARE INSTRUCTIONS     Avoid activities that cause you pain until you recover.  Use ice or cold packs on the problem or painful areas after working out.  Only take over-the-counter or prescription medicines for pain, discomfort, or fever as directed by your caregiver.  Soft shoe inserts or athletic shoes with air or gel sole cushions may be helpful.  If problems continue or become more severe, consult a sports medicine caregiver or your own health care provider. Cortisone is a potent anti-inflammatory medication that may be injected into the painful area. You can discuss this treatment with your caregiver. MAKE SURE YOU:   Understand these instructions.  Will watch your condition.  Will get help right away if you are not doing well or get worse. Document Released: 07/17/2001 Document Revised: 01/14/2012 Document Reviewed: 09/15/2008 [employer] Patient Information 2015 [employer]. This information is not intended to replace advice given to you by your health care provider. Make sure you discuss any questions you have with your health care provider.    ICE INSTRUCTIONS  Apply ice or cold pack to the affected area at least 3 times a day for 10-15 minutes each time.  You should also use ice after prolonged activity or vigorous exercise.  Do not apply ice longer than 20 minutes at one time.  Always keep a cloth between your skin and the ice pack to prevent burns.  Being consistent and following these instructions will help control your symptoms.  We suggest you purchase a gel ice pack because they are   reusable and do bit leak.  Some of them are designed to wrap around the area.  Use the method that works best for you.  Here are some other suggestions for icing.   Use a frozen bag of peas or corn-inexpensive and molds well to your body, usually stays frozen for 10 to 20 minutes.  Wet a towel with cold water and squeeze out the excess until it's damp.  Place in a bag in the freezer for 20 minutes. Then remove  and use. 

## 2014-06-17 DIAGNOSIS — R945 Abnormal results of liver function studies: Secondary | ICD-10-CM | POA: Insufficient documentation

## 2014-06-17 DIAGNOSIS — Z803 Family history of malignant neoplasm of breast: Secondary | ICD-10-CM | POA: Insufficient documentation

## 2014-06-17 DIAGNOSIS — K760 Fatty (change of) liver, not elsewhere classified: Secondary | ICD-10-CM | POA: Insufficient documentation

## 2014-06-17 DIAGNOSIS — E785 Hyperlipidemia, unspecified: Secondary | ICD-10-CM | POA: Insufficient documentation

## 2014-06-17 DIAGNOSIS — F4323 Adjustment disorder with mixed anxiety and depressed mood: Secondary | ICD-10-CM | POA: Insufficient documentation

## 2014-06-17 DIAGNOSIS — N643 Galactorrhea not associated with childbirth: Secondary | ICD-10-CM | POA: Insufficient documentation

## 2014-06-17 DIAGNOSIS — R899 Unspecified abnormal finding in specimens from other organs, systems and tissues: Secondary | ICD-10-CM | POA: Insufficient documentation

## 2014-06-17 DIAGNOSIS — R7309 Other abnormal glucose: Secondary | ICD-10-CM | POA: Insufficient documentation

## 2014-06-17 DIAGNOSIS — M8580 Other specified disorders of bone density and structure, unspecified site: Secondary | ICD-10-CM | POA: Insufficient documentation

## 2014-06-17 DIAGNOSIS — R7989 Other specified abnormal findings of blood chemistry: Secondary | ICD-10-CM | POA: Insufficient documentation

## 2014-06-17 DIAGNOSIS — M159 Polyosteoarthritis, unspecified: Secondary | ICD-10-CM | POA: Insufficient documentation

## 2014-06-23 ENCOUNTER — Ambulatory Visit (INDEPENDENT_AMBULATORY_CARE_PROVIDER_SITE_OTHER): Payer: Medicare Other

## 2014-06-23 VITALS — BP 156/63 | HR 74 | Resp 18

## 2014-06-23 DIAGNOSIS — M722 Plantar fascial fibromatosis: Secondary | ICD-10-CM

## 2014-06-23 DIAGNOSIS — M79605 Pain in left leg: Secondary | ICD-10-CM

## 2014-06-23 DIAGNOSIS — M79609 Pain in unspecified limb: Secondary | ICD-10-CM

## 2014-06-23 MED ORDER — TRIAMCINOLONE ACETONIDE 10 MG/ML IJ SUSP: 10.0000 mg | Freq: Once | INTRAMUSCULAR | Status: DC

## 2014-06-23 NOTE — Patient Instructions (Signed)

## 2014-06-23 NOTE — Progress Notes (Signed)
   Subjective:    Patient ID: Marie Norris, female    DOB: 01-06-1946, 68 y.o.   MRN: [id number]  HPI MY LEFT HEEL IS ABOUT THE SAME AND I DID GET ME SOME INSERTS AND I HAVE MY OLD INSERTS WITH ME TODAY AND THE TAPING DID GOOD    Review of Systems no new findings or systemic changes noted     Objective:   Physical Exam Lower extremity objective findings unchanged vascular status is intact pedal pulses palpable DP and PT +2/4 bilateral epicritic sensations intact and symmetric Or refill time 3 seconds on exam there still some tenderness in the medial band of the plantar fascia medial calcaneal tubercle there has been some improvement she is wearing some a traction orthotics and has her custom orthotics with Spenco top cover both orthotics seem to continue fit and contour well she just started resuming to use the since last seen there still some tenderness on palpation of the medial plantar fascial band of the at the insertion site left heel. No open wounds ulcerations no discolorations and skin no exacerbations noted      Assessment & Plan:  Assessment recalcitrant plantar fasciitis/heel spur syndrome maintain current orthotics maintain ice and anti-inflammatory in the form of MOBIC which was prescribed at this time an adjunct of injection tender with Kenalog 20 mg Marcaine plain infiltrated the inferior left heel medial band of the plantar fascial insertion site patient tolerated the injection well and will maintain orthotics as instructed. Followup in the future on an as-needed basis if not improved the next month or 2 consider other noninvasive invasive options consider physical therapy redoing or refurbishing or new orthotics or if all else fails surgical intervention may need to be considered. Followup in a month or 2 if needed are not improved  Harriet Masson DPM

## 2014-07-15 ENCOUNTER — Telehealth: Payer: Self-pay | Admitting: *Deleted

## 2014-07-15 NOTE — Telephone Encounter (Signed)
It's okay to arrange physical therapy for diagnosis of plantar fasciitis. Deeper is fine recommend therapy 3 times a week for 3-4 weeks.  Recommendation evaluate and treat for plantar fasciitis was recommended modalities as follows;  Ultrasound, iontophoresis with dexamethasone 4 mg per mL, stretching and range of motion exercises  Harriet Masson DPM

## 2014-07-20 NOTE — Telephone Encounter (Signed)
CALLED PATIENT AND LEFT A MESSAGE THAT WE WOULD ARRANGE THE PHYSICAL THERAPY AND WILL CALL HER WHEN WE KNOW A TIME. LISA

## 2014-08-04 ENCOUNTER — Ambulatory Visit (INDEPENDENT_AMBULATORY_CARE_PROVIDER_SITE_OTHER): Payer: Medicare Other

## 2014-08-04 VITALS — BP 124/59 | HR 78 | Resp 18

## 2014-08-04 DIAGNOSIS — M79605 Pain in left leg: Secondary | ICD-10-CM

## 2014-08-04 DIAGNOSIS — M79609 Pain in unspecified limb: Secondary | ICD-10-CM

## 2014-08-04 DIAGNOSIS — M722 Plantar fascial fibromatosis: Secondary | ICD-10-CM

## 2014-08-04 MED ORDER — MELOXICAM 15 MG PO TABS: 15.0000 mg | ORAL_TABLET | Freq: Every day | ORAL | Status: DC

## 2014-08-04 NOTE — Patient Instructions (Signed)

## 2014-08-04 NOTE — Addendum Note (Signed)
Addended by: Cranford Mon R on: 08/04/2014 10:02 AM   Modules accepted: Orders, Medications

## 2014-08-04 NOTE — Progress Notes (Signed)
   Subjective:    Patient ID: Marie Norris, female    DOB: 03/14/1946, 68 y.o.   MRN: [id number]  HPI I AM DOING MUCH BETTER ON MY LEFT HEEL AND I HAVE BEEN GOING TO PHYSICAL THERAPY AND IT HAS HELPED A LOT AND THE THERAPIST HAS BEEN WORKING ON MY LEFT LEG AND IT HAS A KNOT ON IT    Review of Systems no new findings or systemic changes noted     Objective:   Physical Exam Should note neurovascular status is intact pedal pulses are palpable patient is still doing therapy now Gorden Harms is an ultrasound has had significant improvement although not complete resolution still some tenderness in the medial calcaneal tubercle left heel also having some tenderness and some paresthesia in shooting sensation medial tarsal canal and medial mid leg area on the left leg posse some muscle cramping or neuralgia neuritis type symptomology. In general improved with use of orthoses NSAID therapy etc. a satisfactory refill for [facility] is given stress to not use any other NSAIDs or aspirin products all taking meloxicam. At this time also will continue with orthoses stretching and ice and mobilization exercises. Report from physical therapy indicates improvement in motion range of motion and pain advised to continue therapy for at least another week or 2 to complete her 3 to four-week regimen 3 times a week as instructed      Assessment & Plan:  If pain continues to improve or resolve discharge to an as-needed basis or if there is any recurrence or failure to resolve component with the next 2-3 months followup as needed possible additional steroid injection or even consideration of other invasive options and EPF or surgery will be discussed. Next progress Harriet Masson DPM

## 2014-12-02 ENCOUNTER — Telehealth: Payer: Self-pay

## 2014-12-02 NOTE — Telephone Encounter (Signed)
Pt called and needs her xray on disk for an appt on Monday at 200pm. Pt aware there is a charge. It will be $5.00 and she will need to sign a release when she comes in to pick it up. Please call pt and let her know when disk will be ready.

## 2014-12-02 NOTE — Telephone Encounter (Signed)
Printed a copy of her x-rays out on paper and called patient to let her know that I had them ready. Marie Norris

## 2015-03-17 ENCOUNTER — Ambulatory Visit (INDEPENDENT_AMBULATORY_CARE_PROVIDER_SITE_OTHER): Payer: Medicare Other | Admitting: Certified Nurse Midwife

## 2015-03-17 ENCOUNTER — Encounter: Payer: Self-pay | Admitting: Certified Nurse Midwife

## 2015-03-17 VITALS — BP 124/60 | HR 72 | Resp 20 | Ht 60.25 in | Wt 157.0 lb

## 2015-03-17 DIAGNOSIS — Z01419 Encounter for gynecological examination (general) (routine) without abnormal findings: Secondary | ICD-10-CM | POA: Diagnosis not present

## 2015-03-17 DIAGNOSIS — Z124 Encounter for screening for malignant neoplasm of cervix: Secondary | ICD-10-CM

## 2015-03-17 DIAGNOSIS — N952 Postmenopausal atrophic vaginitis: Secondary | ICD-10-CM

## 2015-03-17 MED ORDER — ESTROGENS, CONJUGATED 0.625 MG/GM VA CREA: TOPICAL_CREAM | VAGINAL | Status: DC | PRN

## 2015-03-17 NOTE — Progress Notes (Signed)
69 y.o. G63P2002 Married  Caucasian Fe here for annual exam. Menopausal no vaginal bleeding or vaginal dryness. Using Premarin cream 2 -3 times monthly only or as needed. Patient is having slight urgency now with occasional leaking. Denies urinary frequency or pain with urination.  Sees PCP once yearly for Hypertension and diabetes type 2, labs and aex. Spouse currently has basal cell cancer with surgery soon.  Aware colonoscopy is due and will schedule.  No other health issues today.   Patient's last menstrual period was 05/13/2013.          Sexually active: Yes.    The current method of family planning is tubal ligation.    Exercising: No.  exercise Smoker:  no  Health Maintenance: Pap: 03-15-14 neg MMG:  & u/s 11-22-14 category c density birads 2:neg Colonoscopy:  2011 polyps, 5 years BMD:   2015 normal TDaP:  2012 Labs: none Self breast exam: done monthly   reports that she has never smoked. She has never used smokeless tobacco. She reports that she does not drink alcohol or use illicit drugs.  Past Medical History  Diagnosis Date  . Hypertension   . DVT (deep venous thrombosis)     oral contraceptive use  . History of uterine fibroid   . Diverticulitis 2008  . History of [place] spotted fever 1990  . Breast adenoma 2010    benign  . 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 Torn ligament     left ankle  . Cataract 3-14  . Uterine polyp   . Fibroid   . Personal history of kidney stones   . Shingles   . Arthritis     Past Surgical History  Procedure Laterality Date  . Tubal ligation Bilateral 1983  . Appendectomy  1983  . Cystectomy Right 1983    right ovary  . Breast surgery Left 2010    adenoma removal, biopsy, tissue removal; benign  . Polyp removal    . Hysteroscopic resection    . Dilation and curettage of uterus    . Vaginal delivery  1969, 1973  . Dilatation & curettage/hysteroscopy with trueclear N/A 06/24/2013   Procedure: DILATATION & CURETTAGE/HYSTEROSCOPY WITH TRUECLEAR;  Surgeon: Azalia Bilis, MD;  Location: [facility];  Service: Gynecology;  Laterality: N/A;  . Hand surgery      Current Outpatient Prescriptions  Medication Sig Dispense Refill  . azelastine (OPTIVAR) 0.05 % ophthalmic solution Administer 1 drop to both eyes two (2) times a day as needed.    . Calcium Carbonate-Vitamin D (CALCIUM 600 + D PO) Take 1 tablet by mouth daily.     . Cholecalciferol (D 2000) 2000 UNITS TABS Take by mouth.    . conjugated estrogens (PREMARIN) vaginal cream Place vaginally as needed. 1/2 gram per vagina 2 x weekly 42.5 g 3  . fluticasone (FLONASE) 50 MCG/ACT nasal spray Place 2 sprays into the nose daily as needed for rhinitis.    . furosemide (LASIX) 20 MG tablet Take 20 mg by mouth 3 (three) times a week.     . losartan (COZAAR) 100 MG tablet Take 100 mg by mouth at bedtime.     . meloxicam (MOBIC) 15 MG tablet Take 1 tablet (15 mg total) by mouth daily. 30 tablet 2  . metFORMIN (GLUCOPHAGE) 500 MG tablet Take 500 mg by mouth daily.     . metoprolol succinate (TOPROL-XL) 25 MG 24 hr tablet Take 25 mg by mouth  daily.    . Multiple Vitamin (MULTIVITAMIN) tablet Take 1 tablet by mouth daily.    . Naproxen Sodium (ALEVE) 220 MG CAPS Take by mouth.    . Omega-3 Fatty Acids (FISH OIL) 1000 MG CAPS Take 1,200 mg by mouth.     Current Facility-Administered Medications  Medication Dose Route Frequency Provider Last Rate Last Dose  . triamcinolone acetonide (KENALOG) 10 MG/ML injection 10 mg  10 mg Other Once Harriet Masson, DPM        Family History  Problem Relation Age of Onset  . COPD Mother     stage 65  . Hypertension Mother 22  . Mitral valve prolapse Mother 36  . Crohn's disease Grandchild     granddaughter  . Mitral valve prolapse Sister 65  . Breast cancer Sister   . Colon cancer Father   . Cancer Paternal Aunt     bladder  . Yves Dill Parkinson White syndrome Paternal Uncle   . Breast cancer  Maternal Grandmother   . [place] White syndrome Paternal Grandfather     ?  Marland Kitchen Yves Dill Parkinson White syndrome Cousin   . Yves Dill Parkinson White syndrome Cousin   . Cancer Paternal Aunt     melanoma  . Cancer Paternal Uncle     blood cancer    ROS:  Pertinent items are noted in HPI.  Otherwise, a comprehensive ROS was negative.  Exam:   BP 124/60 mmHg  Pulse 72  Resp 20  Ht 5' 0.25" (1.53 m)  Wt 157 lb (71.215 kg)  BMI 30.42 kg/m2  LMP 05/13/2013 Height: 5' 0.25" (153 cm) Ht Readings from Last 3 Encounters:  03/17/15 5' 0.25" (1.53 m)  03/15/14 5' 0.25" (1.53 m)  07/10/13 5' (1.524 m)    General appearance: alert, cooperative and appears stated age Head: Normocephalic, without obvious abnormality, atraumatic Neck: no adenopathy, supple, symmetrical, trachea midline and thyroid normal to inspection and palpation Lungs: clear to auscultation bilaterally Breasts: normal appearance, no masses or tenderness, No nipple retraction or dimpling, No nipple discharge or bleeding, No axillary or supraclavicular adenopathy Heart: regular rate and rhythm Abdomen: soft, non-tender; no masses,  no organomegaly Extremities: extremities normal, atraumatic, no cyanosis or edema Skin: Skin color, texture, turgor normal. No rashes or lesions Lymph nodes: Cervical, supraclavicular, and axillary nodes normal. No abnormal inguinal nodes palpated Neurologic: Grossly normal   Pelvic: External genitalia:  no lesions              Urethra:  normal appearing urethra with no masses, tenderness or lesions              Bartholin's and Skene's: normal                 Vagina: normal appearing vagina with normal color and discharge, no lesions              Cervix: normal, non tender,no lesions              Pap taken: No. Bimanual Exam:  Uterus:  normal size, contour, position, consistency, mobility, non-tender              Adnexa: normal adnexa and no mass, fullness, tenderness                Rectovaginal: Confirms               Anus:  normal sphincter tone, no lesions  Chaperone present: Yes  A:  Well Woman with normal exam  Menopausal no HRT  Atrophic vaginitis with Premarin Cream use working well with prn use  Hypertension/Cholesterol management with PCP  Personal history of nipple papilloma does her mammograms with [place] yearly for follow up  Family history of colon cancer(father) and breast cancer( sister)  P:   Reviewed health and wellness pertinent to exam  Aware of need to advise if vaginal bleeding  Rx Premarin cream see order  Continue MD follow up as indicated  Pap smear not taken today   counseled on breast self exam, mammography screening, adequate intake of calcium and vitamin D, diet and exercise  return annually or prn  An After Visit Summary was printed and given to the patient.

## 2015-03-17 NOTE — Patient Instructions (Signed)

## 2015-03-21 NOTE — Progress Notes (Signed)
Reviewed personally.  M. Suzanne Precilla Purnell, MD.  

## 2015-04-15 ENCOUNTER — Telehealth: Payer: Self-pay | Admitting: Certified Nurse Midwife

## 2015-04-15 DIAGNOSIS — N950 Postmenopausal bleeding: Secondary | ICD-10-CM

## 2015-04-15 DIAGNOSIS — Z86018 Personal history of other benign neoplasm: Secondary | ICD-10-CM

## 2015-04-15 NOTE — Telephone Encounter (Signed)
Spoke with patient. Advised I have spoken with Dr.Miller who recommends follow up [facility] in office for further evaluation. Patient is agreeable. SHGM scheduled for 6/23 at 3pm with 3:30 consult with Dr.Miller. Patient is agreeable to date and time. Order placed for precert.  Routing to provider for final review. Patient agreeable to disposition.

## 2015-04-15 NOTE — Telephone Encounter (Signed)
Patient is postmenopausal and having some vaginal spotting. Chart to triage.

## 2015-04-15 NOTE — Telephone Encounter (Addendum)
Spoke with patient. Patient states that she experienced vaginal bleeding after intercourse on 04/10/2015. Bleeding was bright red and lasted all day on 6/5. Has not had any further bleeding. Denies any pelvic or abdominal discomfort. Patient has a history of polyps. SHGM on 06/09/2013 with Dr.Lathrop which showed "cornual mass" per note. Had previous D&C in 2009 for polyp removal. Had another D&C in 06/2013 with Dr.Lathrop for polyp removal. Last aex on 03/17/2015 with Regina Eck CNM.Advised patient will speak with Dr.Miller regarding recent vaginal bleeding and return call with further recommendations. Patient is agreeable.  Dr.Miller, repeat SHGM in office with consult?

## 2015-04-28 ENCOUNTER — Other Ambulatory Visit: Payer: Self-pay | Admitting: Obstetrics & Gynecology

## 2015-04-28 ENCOUNTER — Ambulatory Visit (INDEPENDENT_AMBULATORY_CARE_PROVIDER_SITE_OTHER): Payer: Medicare Other | Admitting: Obstetrics & Gynecology

## 2015-04-28 ENCOUNTER — Ambulatory Visit (INDEPENDENT_AMBULATORY_CARE_PROVIDER_SITE_OTHER): Payer: Medicare Other

## 2015-04-28 VITALS — BP 128/74 | Wt 156.0 lb

## 2015-04-28 DIAGNOSIS — D251 Intramural leiomyoma of uterus: Secondary | ICD-10-CM | POA: Diagnosis not present

## 2015-04-28 DIAGNOSIS — Z86718 Personal history of other venous thrombosis and embolism: Secondary | ICD-10-CM | POA: Diagnosis not present

## 2015-04-28 DIAGNOSIS — N950 Postmenopausal bleeding: Secondary | ICD-10-CM | POA: Diagnosis not present

## 2015-04-28 DIAGNOSIS — Z8742 Personal history of other diseases of the female genital tract: Secondary | ICD-10-CM | POA: Diagnosis not present

## 2015-04-28 DIAGNOSIS — N930 Postcoital and contact bleeding: Secondary | ICD-10-CM

## 2015-04-28 DIAGNOSIS — Z86018 Personal history of other benign neoplasm: Secondary | ICD-10-CM

## 2015-04-28 NOTE — Progress Notes (Addendum)
69 y.o. Marriedfemale here for a pelvic ultrasound with sonohystogram due to episode of PMP bleeding experienced by pt on 04/10/15 after intercourse.  This was not very much bleeding.  Pt does have a hx of endometrial polyps so was worried some about this.  Also h/o [facility] 06/08/13 with Dr. Elveria Rising showing "cornual mass".  Then had D&C following for polyp removal.  Ultrasound images and operative notes reviewed.  Tissue was scant for pathology and essentially non-diagnostic except for "minute" amount of benign endometrial tissue.  Patient's last menstrual period was 05/13/2013.  Contraception: BTL  Technique:  Both transabdominal and transvaginal ultrasound examinations of the pelvis were performed. Transabdominal technique was performed for global imaging of the pelvis including uterus, ovaries, adnexal regions, and pelvic cul-de-sac.  It was necessary to proceed with endovaginal exam following the abdominal ultrasound transabdominal exam to visualize the endometrium and adnexa.  Color and duplex Doppler ultrasound was utilized to evaluate blood flow to the ovaries.   FINDINGS: Uterus: 5.5 x 4.8 x 3.8cm with 1.4cm stable intramural fibroid  Endometrium: 6.57mm before SHGM, 4.61mm after Adnexa:  Left: 1.6 x 1.1 x 0.8cm     Right: 1.7 x 1.4 x 0.8cm Cul de sac: no free fluid  SHSG:  After obtaining appropriate verbal consent from patient, the cervix was visualized using a speculum, and prepped with betadine.  A tenaculum  was applied to the cervix.  Dilation of the cervix was not necessary. The catheter was passed into the uterus and sterile saline introduced, with the following findings: no intracavitary lesions noted.  Irregular appearing endometrium on ultrasound with sonohysterography is much thinner.  Appearance is more consistent with adenomyosis history.  Endometrial biopsy then recommended.  Discussed with patient.  Verbal and written consent obtained.  Cervix visualized and cleansed with  betadine prep.  A single toothed tenaculum was applied to the anterior lip of the cervix.  Endometrial pipelle was advanced through the cervix into the endometrial cavity without difficulty.  Pipelle passed to 5.5cm.  Suction applied and pipelle removed with scant tissue sample obtained.  Second pass performed.  Tenculum removed.  No bleeding noted.  Patient tolerated procedure well.  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  Due to prior procedure with non-diagnostic pathology, biopsy was performed today.  Pathology will be called to pt.  Reviewed with pt DVT hx.  She is on vaginal estrogen once every two weeks due to atrophic changes.  Feel no estrogen is the best option for this pt but she has been on this for "years" without any side effect.  She voices clear understanding of risks but desires to continue to significant improvement in vaginal dryness symptoms.  ~25 minutes spent with patient >50% of time was in face to face discussion of above.

## 2015-05-01 ENCOUNTER — Encounter: Payer: Self-pay | Admitting: Obstetrics & Gynecology

## 2015-05-01 DIAGNOSIS — Z86718 Personal history of other venous thrombosis and embolism: Secondary | ICD-10-CM | POA: Insufficient documentation

## 2015-05-05 NOTE — Addendum Note (Signed)
Addended by: Megan Salon on: 05/05/2015 05:36 PM   Modules accepted: Orders

## 2015-05-12 ENCOUNTER — Encounter: Payer: Self-pay | Admitting: Obstetrics & Gynecology

## 2015-05-13 ENCOUNTER — Telehealth: Payer: Self-pay | Admitting: Emergency Medicine

## 2015-05-13 NOTE — Telephone Encounter (Signed)
Called patient with results via telephone message.

## 2015-05-13 NOTE — Telephone Encounter (Signed)
Call to patient with message from Dr. Migdalia Dk. Patient verbalized understanding of results and recommendation for follow up in 3 months. Office visit scheduled.  Patient denies any further bleeding. She is advised to call back if any new vaginal bleeding occurs.  Routing to provider for final review. Patient agreeable to disposition. Will close encounter.

## 2015-05-13 NOTE — Telephone Encounter (Signed)
-----   Message from Megan Salon, MD sent at 05/12/2015  9:05 AM EDT ----- Please inform pt endometrial biopsy was negative for abnormal cells.  If has no other bleeding, nothing else needed right now.  Schedule 3 month follow up please for recheck with me.  Thanks.

## 2015-08-11 ENCOUNTER — Ambulatory Visit (INDEPENDENT_AMBULATORY_CARE_PROVIDER_SITE_OTHER): Payer: Medicare Other | Admitting: Obstetrics & Gynecology

## 2015-08-11 VITALS — BP 128/56 | HR 72 | Resp 16 | Wt 153.0 lb

## 2015-08-11 DIAGNOSIS — N950 Postmenopausal bleeding: Secondary | ICD-10-CM

## 2015-08-11 DIAGNOSIS — N930 Postcoital and contact bleeding: Secondary | ICD-10-CM

## 2015-08-11 DIAGNOSIS — Z86718 Personal history of other venous thrombosis and embolism: Secondary | ICD-10-CM | POA: Diagnosis not present

## 2015-08-11 DIAGNOSIS — N898 Other specified noninflammatory disorders of vagina: Secondary | ICD-10-CM

## 2015-08-11 NOTE — Patient Instructions (Signed)
Replens vaginal moisturizer twice weekly

## 2015-08-17 ENCOUNTER — Encounter: Payer: Self-pay | Admitting: Obstetrics & Gynecology

## 2015-08-17 NOTE — Progress Notes (Signed)
Very nice 69 yo G2P2 MWF here for follow-up after having episode of PMP bleeding 04/28/15.  This occurred after intercourse.  Pt has a hx of endometrial polyps with hysteroscopy with D&C performed 06/24/13.  Scant tissue was present so this was essentially non-diagnostic.  SHGM was performed 04/27/14 with uterus measuring 5.5 x 4.8 x 3.8cm with stable 1.4cm intramural fibroid.  Endometrium appeared thin with saline infusion but did have adenomyosis appearance.  Endometrial biopsy was obtained.  I am sure this was obtained with good placement of the endometrial pipelle and placement to the fundus of the uterine cavity.  Pathology was negative.  Reviewed all of this again with pt.  She has not experienced any additional bleeding since the episodes that proceeded evaluation in June.  When pt was in the office in June, history was reviewed and h/o DVT while on OCPs was discussed.  She was using vaginal estrogen cream to help with vaginal dryness at the time.  I HIGHLY encouraged her to stop using this as she has a hx of DVT while using an estrogen.  Although, at the time, she felt that was unnecessary as she had used this for years, she reports she has actually almost completely stopped usage.  Every time she used the vaginal estrogen, she thought about our discussion and just basically stopped.  Reports she has used just a few times in the last three months.  Although she is having some dryness, this isn't significant and she if comfortable stopping the estrogen completely.  I agree with this decision.  Pt and I discussed options for lubrication and/or moisture.  Replens vaginal moisturizer, coconut oil, olive oil, and lubricant options discussed.  All questions answered.  Assessment:  H/O PMP bleeding with evaluation in 6/16.  Negative endometrial biopsy. H/O DVT on OCPs.  Now has stopped vaginal estrogen use.  Mild vaginal dryness that is manageable.  Plan:  Estrogen removed from medication list.  Information  about OTC products for lubrication and moisture provided.  Pt will follow up for AEX as scheduled.  ~15 minutes spent with patient >50% of time was in face to face discussion of above.

## 2016-03-20 ENCOUNTER — Ambulatory Visit: Payer: Medicare Other | Admitting: Certified Nurse Midwife

## 2016-04-13 ENCOUNTER — Ambulatory Visit (INDEPENDENT_AMBULATORY_CARE_PROVIDER_SITE_OTHER): Payer: Medicare Other | Admitting: Certified Nurse Midwife

## 2016-04-13 ENCOUNTER — Encounter: Payer: Self-pay | Admitting: Certified Nurse Midwife

## 2016-04-13 VITALS — BP 120/70 | HR 80 | Resp 16 | Ht 59.5 in | Wt 157.0 lb

## 2016-04-13 DIAGNOSIS — Z01419 Encounter for gynecological examination (general) (routine) without abnormal findings: Secondary | ICD-10-CM | POA: Diagnosis not present

## 2016-04-13 DIAGNOSIS — Z124 Encounter for screening for malignant neoplasm of cervix: Secondary | ICD-10-CM | POA: Diagnosis not present

## 2016-04-13 DIAGNOSIS — Z0000 Encounter for general adult medical examination without abnormal findings: Secondary | ICD-10-CM | POA: Diagnosis not present

## 2016-04-13 NOTE — Patient Instructions (Signed)

## 2016-04-13 NOTE — Progress Notes (Signed)
70 y.o. G69P2002 Married  Caucasian Fe here for annual exam.  Menopausal no HRT Denies vaginal dryness or vaginal bleeding. Much social stress with brother's illness.(2 strokes at age 77) she is very close with him. She is also helping with his care. She is complaining of RLQ that comes and goes when occurs is sharp and will last all day when occurs. Does not require medication when occurs. Does not occur but once or twice in a month. Recent PUS due to PMB with normal results. Has history of diverticulitis and feel this pain be the issues. Arthritis in hands are changing has not seen rheumatology again. Plans to set up another appointment. Recent cataracts surgery due to sudden change. Sees so much better. Still staying active with walking and golf. " Planning to be the year of me".  Has area on arm and leg with skin change, please check. Sees PCP for labs and medication management for hypertension/diabetes and allergies, aex. No other health concerns today. Planning to take some vacation hopefully soon.    Patient's last menstrual period was 06/18/2004.          Sexually active: Yes.    The current method of family planning is tubal ligation.    Exercising: Yes.    walking & golf Smoker:  no  Health Maintenance: Pap:  03-15-14 neg MMG: 11-29-15 category c density,birads 1:neg Colonoscopy:  2011 polyps f/u 79yrs  Plans to call to see when actually due BMD:   2015 osteoporosis with PCP management with calcium, vitamin D TDaP: 2012 Shingles: 2017 Pneumonia: 2016 Hep C and HIV: HIV neg yrs ago, Labs: pcp Self breast exam: done daily   reports that she has never smoked. She has never used smokeless tobacco. She reports that she does not drink alcohol or use illicit drugs.  Past Medical History  Diagnosis Date  . Hypertension   . DVT (deep venous thrombosis) ([place])     oral contraceptive use  . History of uterine fibroid   . Diverticulitis 2008  . History of Rocky Mountain spotted fever 1990  .  Breast adenoma 2010    benign  . 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 Torn ligament     left ankle  . Cataract 3-14  . Uterine polyp   . Fibroid   . Personal history of kidney stones   . Shingles   . Arthritis     Past Surgical History  Procedure Laterality Date  . Tubal ligation Bilateral 1983  . Appendectomy  1983  . Cystectomy Right 1983    right ovary  . Breast surgery Left 2010    adenoma removal, biopsy, tissue removal; benign  . Polyp removal    . Hysteroscopic resection    . Dilation and curettage of uterus    . Vaginal delivery  1969, 1973  . Dilatation & curettage/hysteroscopy with trueclear N/A 06/24/2013    Procedure: DILATATION & CURETTAGE/HYSTEROSCOPY WITH TRUECLEAR;  Surgeon: Azalia Bilis, MD;  Location: [facility];  Service: Gynecology;  Laterality: N/A;  . Hand surgery    . Cataract extraction w/ intraocular lens implant      Current Outpatient Prescriptions  Medication Sig Dispense Refill  . Calcium Carbonate-Vitamin D (CALCIUM 600 + D PO) Take 1 tablet by mouth daily.     . Cholecalciferol (D 2000) 2000 UNITS TABS Take by mouth.    . fluticasone (FLONASE) 50 MCG/ACT nasal spray Place 2 sprays into the nose daily  as needed for rhinitis.    . furosemide (LASIX) 20 MG tablet Take 20 mg by mouth 3 (three) times a week.     . losartan (COZAAR) 100 MG tablet Take 100 mg by mouth at bedtime.     . metFORMIN (GLUCOPHAGE) 500 MG tablet Take 500 mg by mouth daily.     . metoprolol succinate (TOPROL-XL) 25 MG 24 hr tablet Take 25 mg by mouth daily.    . Multiple Vitamin (MULTIVITAMIN) tablet Take 1 tablet by mouth daily.    . Naproxen Sodium (ALEVE) 220 MG CAPS Take by mouth.    . Omega-3 Fatty Acids (FISH OIL) 1000 MG CAPS Take 1,200 mg by mouth.    . Polyvinyl Alcohol-Povidone (REFRESH OP) Apply to eye.     Current Facility-Administered Medications  Medication Dose Route Frequency Provider Last Rate Last Dose  . triamcinolone  acetonide (KENALOG) 10 MG/ML injection 10 mg  10 mg Other Once Harriet Masson, DPM        Family History  Problem Relation Age of Onset  . COPD Mother     stage 31  . Hypertension Mother 51  . Mitral valve prolapse Mother 65  . Crohn's disease Grandchild     granddaughter  . Mitral valve prolapse Sister 52  . Breast cancer Sister   . Colon cancer Father   . Cancer Paternal Aunt     bladder  . Yves Dill Parkinson White syndrome Paternal Uncle   . Breast cancer Maternal Grandmother   . [place] White syndrome Paternal Grandfather     ?  Marland Kitchen Yves Dill Parkinson White syndrome Cousin   . Yves Dill Parkinson White syndrome Cousin   . Cancer Paternal Aunt     melanoma  . Cancer Paternal Uncle     blood cancer  . Aneurysm Brother   . Stroke Brother   . AVM Brother     ROS:  Pertinent items are noted in HPI.  Otherwise, a comprehensive ROS was negative.  Exam:   BP 120/70 mmHg  Pulse 80  Resp 16  Ht 4' 11.5" (1.511 m)  Wt 157 lb (71.215 kg)  BMI 31.19 kg/m2  LMP 06/18/2004 Height: 4' 11.5" (151.1 cm) Ht Readings from Last 3 Encounters:  04/13/16 4' 11.5" (1.511 m)  03/17/15 5' 0.25" (1.53 m)  03/15/14 5' 0.25" (1.53 m)    General appearance: alert, cooperative and appears stated age Head: Normocephalic, without obvious abnormality, atraumatic Neck: no adenopathy, supple, symmetrical, trachea midline and thyroid normal to inspection and palpation Lungs: clear to auscultation bilaterally Breasts: normal appearance, no masses or tenderness, No nipple retraction or dimpling, No nipple discharge or bleeding, No axillary or supraclavicular adenopathy Heart: regular rate and rhythm Abdomen: soft, non-tender; no masses,  no organomegaly, no point of tenderness noted Extremities: extremities normal, atraumatic, no cyanosis or edema Skin: Skin color, texture, turgor normal. No rashes or lesions, left mid arm small grey purple skin change, circular, smooth and on left leg irregular skin  color change, texture feel. Lymph nodes: Cervical, supraclavicular, and axillary nodes normal. No abnormal inguinal nodes palpated Neurologic: Grossly normal   Pelvic: External genitalia:  no lesions              Urethra:  normal appearing urethra with no masses, tenderness or lesions              Bartholin's and Skene's: normal                 Vagina: normal  appearing vagina with normal color and discharge, no lesions              Cervix: no cervical motion tenderness, no lesions and normal appearance, bleeding with pap only              Pap taken: Yes.   Bimanual Exam:  Uterus:  normal size, contour, position, consistency, mobility, non-tender and anteverted              Adnexa: normal adnexa and no mass, fullness, tenderness               Rectovaginal: Confirms               Anus:  normal sphincter tone, no lesions  Chaperone present: yes  A:  Well Woman with normal exam  Menopausal no HRT now, stopped this year due to PMB bleeding  Diverticulitis history ? RLQ pain related, will check with her GI  Skin color changes on left forearm and left leg  Arthritis needs follow up  History of polyps with colonoscopy due for follow up  Hypertension/diabetes and osteoporosis management with PCP  Social stress with brother illness  Screening lab  Family history of breast cancer sister and MGM, colon cancer father    P:   Reviewed health and wellness pertinent to exam  Discussed need to evaluate if vaginal bleeding as before  Discussed follow up with GI regarding pain and need for colonoscopy again, patient will schedule  Discussed skin finding and needs evaluation with Dr. Tamala Julian again  Plans to schedule again with Rheumatology, declines referral  Encouraged friend and community support as needed.  Lab: Hep C  Pap smear as above   counseled on breast self exam, mammography screening, osteoporosis, adequate intake of calcium and vitamin D, diet and exercise  return annually or  prn  An After Visit Summary was printed and given to the patient.

## 2016-04-13 NOTE — Progress Notes (Signed)
Reviewed personally.  M. Suzanne Darla Mcdonald, MD.  

## 2016-04-14 LAB — HEPATITIS C ANTIBODY: HCV AB: NEGATIVE

## 2016-04-17 LAB — IPS PAP SMEAR ONLY

## 2016-09-13 DIAGNOSIS — K5732 Diverticulitis of large intestine without perforation or abscess without bleeding: Secondary | ICD-10-CM | POA: Insufficient documentation

## 2016-12-12 ENCOUNTER — Encounter: Payer: Self-pay | Admitting: Obstetrics and Gynecology

## 2017-04-16 ENCOUNTER — Encounter: Payer: Self-pay | Admitting: Certified Nurse Midwife

## 2017-04-16 ENCOUNTER — Ambulatory Visit (INDEPENDENT_AMBULATORY_CARE_PROVIDER_SITE_OTHER): Payer: Medicare Other | Admitting: Certified Nurse Midwife

## 2017-04-16 VITALS — BP 108/60 | HR 72 | Resp 16 | Ht 59.75 in | Wt 155.0 lb

## 2017-04-16 DIAGNOSIS — Z01419 Encounter for gynecological examination (general) (routine) without abnormal findings: Secondary | ICD-10-CM

## 2017-04-16 DIAGNOSIS — Z124 Encounter for screening for malignant neoplasm of cervix: Secondary | ICD-10-CM

## 2017-04-16 DIAGNOSIS — N952 Postmenopausal atrophic vaginitis: Secondary | ICD-10-CM

## 2017-04-16 DIAGNOSIS — Z803 Family history of malignant neoplasm of breast: Secondary | ICD-10-CM | POA: Diagnosis not present

## 2017-04-16 DIAGNOSIS — Z872 Personal history of diseases of the skin and subcutaneous tissue: Secondary | ICD-10-CM | POA: Diagnosis not present

## 2017-04-16 NOTE — Progress Notes (Addendum)
71 y.o. G87P2002 Married  Caucasian Fe here for annual exam. Menopausal no HRT, denies vaginal bleeding or vaginal dryness issues. Stressful situations with extended family members and 2 children. Granddaughter lives with her with now, due to Crohns disease. Sees PCP yearly for hypertension,and type 2 diabetes management, labs and aex. All normal per patient.. Patient was told she should have MRI due to density at last mammogram and was told PCP should order. Dr Selena Batten would not order, due to does not do her breast exam. Patient did not call here to follow up due to family social stress. "waiting for things to improve so I can rest". No other health issues today. Planning a vacation if possible!  Patient's last menstrual period was 05/13/2013.          Sexually active: Yes.    The current method of family planning is tubal ligation.    Exercising: No.  exercise Smoker:  no  Health Maintenance: Pap:  03-15-14 neg, 04-13-16 neg History of Abnormal Pap: no MMG:  1/18 bilateral & rt breast U/s f/u 9yr, MRI recommended Self Breast exams: yes Colonoscopy:  2011 polyps f/u 65yrs BMD:   2015 pcp manages TDaP:  2012 Shingles: 2017 Pneumonia: 2016 Hep C and HIV: HIV neg yrs ago, Hep c neg 2017 Labs: none   reports that she has never smoked. She has never used smokeless tobacco. She reports that she does not drink alcohol or use drugs.  Past Medical History:  Diagnosis Date  . Arthritis   . Breast adenoma 2010   benign  . Cataract 3-14  . Diverticulitis 2008  . DVT (deep venous thrombosis) ([place])    oral contraceptive use  . Fibroid   . History of Rocky Mountain spotted fever 1990  . History of uterine fibroid   . Hypertension   . 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 Personal history of kidney stones   . Shingles   . Torn ligament    left ankle  . Uterine polyp     Past Surgical History:  Procedure Laterality Date  . APPENDECTOMY  1983  . BREAST SURGERY  Left 2010   adenoma removal, biopsy, tissue removal; benign  . CATARACT EXTRACTION W/ INTRAOCULAR LENS IMPLANT    . CYSTECTOMY Right 1983   right ovary  . DILATATION & CURETTAGE/HYSTEROSCOPY WITH TRUECLEAR N/A 06/24/2013   Procedure: DILATATION & CURETTAGE/HYSTEROSCOPY WITH TRUECLEAR;  Surgeon: Azalia Bilis, MD;  Location: [facility];  Service: Gynecology;  Laterality: N/A;  . DILATION AND CURETTAGE OF UTERUS    . HAND SURGERY    . hysteroscopic resection    . polyp removal    . TUBAL LIGATION Bilateral 1983  . VAGINAL DELIVERY  1969, 1973    Current Outpatient Prescriptions  Medication Sig Dispense Refill  . Calcium Carbonate-Vitamin D (CALCIUM 600 + D PO) Take 1 tablet by mouth daily.     . Cholecalciferol (D 2000) 2000 UNITS TABS Take by mouth.    . fluticasone (FLONASE) 50 MCG/ACT nasal spray Place 2 sprays into the nose daily as needed for rhinitis.    . furosemide (LASIX) 20 MG tablet Take 20 mg by mouth 3 (three) times a week.     . losartan (COZAAR) 100 MG tablet Take 100 mg by mouth at bedtime.     . metFORMIN (GLUCOPHAGE) 500 MG tablet Take 500 mg by mouth daily.     . metoprolol succinate (TOPROL-XL) 25 MG 24 hr tablet  Take 25 mg by mouth daily.    . Multiple Vitamin (MULTIVITAMIN) tablet Take 1 tablet by mouth daily.    . Naproxen Sodium (ALEVE) 220 MG CAPS Take by mouth.    . Omega-3 Fatty Acids (FISH OIL) 1000 MG CAPS Take 1,200 mg by mouth.    . Polyvinyl Alcohol-Povidone (REFRESH OP) Apply to eye.    . Probiotic Product (PROBIOTIC PO) Take by mouth daily.     No current facility-administered medications for this visit.     Family History  Problem Relation Age of Onset  . COPD Mother        stage 88  . Hypertension Mother 69  . Mitral valve prolapse Mother 14  . Crohn's disease Grandchild        granddaughter  . Mitral valve prolapse Sister 24  . Breast cancer Sister   . Colon cancer Father   . Cancer Paternal Aunt        bladder  . Yves Dill Parkinson White  syndrome Paternal Uncle   . Breast cancer Maternal Grandmother   . [place] White syndrome Paternal Grandfather        ?  Marland Kitchen Yves Dill Parkinson White syndrome Cousin   . Yves Dill Parkinson White syndrome Cousin   . Cancer Paternal Aunt        melanoma  . Cancer Paternal Uncle        blood cancer  . Aneurysm Brother   . Stroke Brother   . AVM Brother     ROS:  Pertinent items are noted in HPI.  Otherwise, a comprehensive ROS was negative.  Exam:   BP 108/60   Pulse 72   Resp 16   Ht 4' 11.75" (1.518 m)   Wt 155 lb (70.3 kg)   LMP 05/13/2013   BMI 30.53 kg/m  Height: 4' 11.75" (151.8 cm) Ht Readings from Last 3 Encounters:  04/16/17 4' 11.75" (1.518 m)  04/13/16 4' 11.5" (1.511 m)  03/17/15 5' 0.25" (1.53 m)    General appearance: alert, cooperative and appears stated age Head: Normocephalic, without obvious abnormality, atraumatic Neck: no adenopathy, supple, symmetrical, trachea midline and thyroid normal to inspection and palpation Lungs: clear to auscultation bilaterally Breasts: normal appearance, no masses or tenderness, No nipple retraction or dimpling, No nipple discharge or bleeding, No axillary or supraclavicular adenopathy Heart: regular rate and rhythm Abdomen: soft, non-tender; no masses,  no organomegaly Extremities: extremities normal, atraumatic, no cyanosis or edema Skin: Skin color, texture, turgor normal. No rashes or lesions Lymph nodes: Cervical, supraclavicular, and axillary nodes normal. No abnormal inguinal nodes palpated Neurologic: Grossly normal   Pelvic: External genitalia:  no lesions              Urethra:  normal appearing urethra with no masses, tenderness or lesions              Bartholin's and Skene's: normal                 Vagina: atrophic appearing vagina with normal color and discharge, no lesions              Cervix: no cervical motion tenderness, no lesions and y              Pap taken: no Bimanual Exam:  Uterus:  normal size,  contour, position, consistency, mobility, non-tender              Adnexa: normal adnexa and no mass, fullness, tenderness  Rectovaginal: Confirms               Anus:  normal sphincter tone, no lesions  Chaperone present: yes  A:  Well Woman with normal exam  Menopausal no HRT  Atrophic vaginitis using coconut oil with no change, will not use estrogen due to breast concerns  Previous breast cyst benign finding  Elevated lifetime risk of 29% putting her above recommendation for screening MRIs.  See mammogram note. Discussed information with patient and will work with her to schedule. She will be notified once this has be done.( Triage aware and will call GI to see if mammogram done is within appropriate guidelines for MRI and contact patient.)  Hypertension,diabetes type 2 management with PCP  Social stress    P:   Reviewed health and wellness pertinent to exam  Aware of need to advise if vaginal bleeding  Discussed atrophic vaginitis finding and discussed Replens OTC use and OTC counter [place] moisturizer for trial to see if this works better. Instructions given. Patient will advise if no change.  Patient will be contacted regarding breast follow up, see above note.  Continue follow up with PCP as  Indicated  Encouraged to seek friend and church support as needed for stress with family. Counseling option is also there for her. Will consider.  Pap smear: no  counseled on breast self exam, mammography screening, adequate intake of calcium and vitamin D, diet and exercise  return annually or prn  An After Visit Summary was printed and given to the patient.

## 2017-04-16 NOTE — Patient Instructions (Addendum)

## 2017-04-18 ENCOUNTER — Telehealth: Payer: Self-pay | Admitting: *Deleted

## 2017-04-18 DIAGNOSIS — Z872 Personal history of diseases of the skin and subcutaneous tissue: Secondary | ICD-10-CM

## 2017-04-18 DIAGNOSIS — R922 Inconclusive mammogram: Secondary | ICD-10-CM

## 2017-04-18 DIAGNOSIS — Z9189 Other specified personal risk factors, not elsewhere classified: Secondary | ICD-10-CM

## 2017-04-18 DIAGNOSIS — Z803 Family history of malignant neoplasm of breast: Secondary | ICD-10-CM

## 2017-04-18 NOTE — Telephone Encounter (Signed)
Left message to call Sharee Pimple at [phone] in regards to imaging discussed with Melvia Heaps, CNM at last OV. Ok to leave detailed message per current dpr.    Order placed for bilateral breast MRI.

## 2017-04-18 NOTE — Telephone Encounter (Signed)
-----   Message from Regina Eck, CNM sent at 04/16/2017  6:02 PM EDT ----- See chart note regarding scheduling MRI of breasts, Patient aware she will be called with information

## 2017-04-19 NOTE — Telephone Encounter (Signed)
Spoke with patient, advised she should be receieving a call from [facility] for scheduling Breast MRI previously discussed with Melvia Heaps, CNM. Our insurance/benefit department will then need to pre-authorize MRI, will return call once completed. Patient verbalizes understanding and is agreeable.   Cc: Lerry Liner

## 2017-05-01 NOTE — Telephone Encounter (Signed)
Deloris Ping,  Per review of EPIC, patient scheduled for Breast MRI on 05/04/17 at [facility]. Precert completed? OK to close encounter?  Cc: Melvia Heaps, CNM, Dr. Sabra Heck

## 2017-05-02 NOTE — Telephone Encounter (Signed)
Patient is scheduled for an MRI on 05/04/17 with [facility].  Patients insurance with Medicare does not require prior authorization. OK to close encounter   cc: Melvia Heaps, CNM, Dr Sabra Heck  cc: Glorianne Manchester, RN

## 2017-05-04 ENCOUNTER — Other Ambulatory Visit

## 2017-05-04 ENCOUNTER — Ambulatory Visit
Admission: RE | Admit: 2017-05-04 | Discharge: 2017-05-04 | Disposition: A | Discharge status: Home / Self Care | Payer: Medicare Other | Source: Ambulatory Visit | Attending: Obstetrics & Gynecology | Admitting: Obstetrics & Gynecology

## 2017-05-04 DIAGNOSIS — R9230 Dense breasts, unspecified: Secondary | ICD-10-CM

## 2017-05-04 DIAGNOSIS — Z803 Family history of malignant neoplasm of breast: Secondary | ICD-10-CM

## 2017-05-04 DIAGNOSIS — Z9189 Other specified personal risk factors, not elsewhere classified: Secondary | ICD-10-CM

## 2017-05-04 DIAGNOSIS — Z872 Personal history of diseases of the skin and subcutaneous tissue: Secondary | ICD-10-CM

## 2017-05-04 DIAGNOSIS — R922 Inconclusive mammogram: Secondary | ICD-10-CM

## 2017-05-04 MED ORDER — GADOBENATE DIMEGLUMINE 529 MG/ML IV SOLN
14.0000 mL | Freq: Once | INTRAVENOUS | Status: AC | PRN
Start: 2017-05-04 — End: 2017-05-04
  Administered 2017-05-04: 14 mL via INTRAVENOUS

## 2017-05-13 ENCOUNTER — Telehealth: Payer: Self-pay | Admitting: Certified Nurse Midwife

## 2017-05-13 NOTE — Telephone Encounter (Signed)
Spoke with patient. Patient states she helped lift a heavy object yesterday and 3 hours later she began having light pink bleeding. Denies any pain. No recent sexual activity. Has a history of uterine fibroids. Reports bleeding is very light today and dark brown. Advised she will need to be seen for further evaluation. Appointment scheduled for 05/14/2017 at 2 pm with Dr.Miller. Patient is agreeable to date and time.  CC: Marie Norris CNM   Routing to provider for final review. Patient agreeable to disposition. Will close encounter.

## 2017-05-13 NOTE — Telephone Encounter (Signed)
Patient is having menopausal issues and wants to come in for an appointment.

## 2017-05-14 ENCOUNTER — Ambulatory Visit (INDEPENDENT_AMBULATORY_CARE_PROVIDER_SITE_OTHER): Payer: Medicare Other

## 2017-05-14 ENCOUNTER — Ambulatory Visit (INDEPENDENT_AMBULATORY_CARE_PROVIDER_SITE_OTHER): Payer: Medicare Other | Admitting: Obstetrics & Gynecology

## 2017-05-14 ENCOUNTER — Encounter: Payer: Self-pay | Admitting: Obstetrics & Gynecology

## 2017-05-14 VITALS — BP 137/60 | HR 90 | Resp 20 | Ht 59.75 in | Wt 157.1 lb

## 2017-05-14 DIAGNOSIS — N950 Postmenopausal bleeding: Secondary | ICD-10-CM | POA: Diagnosis not present

## 2017-05-14 DIAGNOSIS — D251 Intramural leiomyoma of uterus: Secondary | ICD-10-CM | POA: Insufficient documentation

## 2017-05-14 NOTE — Progress Notes (Signed)
GYNECOLOGY  VISIT   HPI: 71 y.o. G13P2002 Married Caucasian female here for complaint of vaginal bleeding after lifting something heavy on Saturday.  This was bright red and then lightened.  It is now brownish.  She did have some cramping that occurred before the bleeding started.  Pt denies any urinary or bowel habit changes.  She also denies pelvic pain or fever.  Had episode of PMP bleeding in 2014.  Dr. Charlies Constable performed hysteroscopy with polyp resection at that time.  Pathology from procedure was non-diagnostic.  Pt had second episode of bleeding in 2016.  SHGM and endometrial biopsy were performed at that time.  Superficial inactive endometrium was noted at that time.  She's had no other bleeding since 2016 until the last few days.    Last pap smear, 6/9/17and was normal.    GYNECOLOGIC HISTORY: Patient's last menstrual period was 05/13/2013. Contraception: PMP Menopausal hormone therapy: none  Patient Active Problem List   Diagnosis Date Noted  . History of DVT (deep vein thrombosis) 05/01/2015  . Abnormal blood sugar 06/17/2014  . Abnormal LFTs 06/17/2014  . Adjustment disorder with mixed anxiety and depressed mood 06/17/2014  . Abnormal laboratory test 06/17/2014  . Family history of breast cancer 06/17/2014  . Fatty infiltration of liver 06/17/2014  . Galactorrhea 06/17/2014  . HLD (hyperlipidemia) 06/17/2014  . Generalized OA 06/17/2014  . Osteopenia 06/17/2014  . Post-menopausal bleeding 06/09/2013  . Essential hypertension, benign 06/09/2013    Past Medical History:  Diagnosis Date  . Arthritis   . Breast adenoma 2010   benign  . Cataract 3-14  . Diverticulitis 2008  . DVT (deep venous thrombosis) ([place])    oral contraceptive use  . Fibroid   . History of Rocky Mountain spotted fever 1990  . History of uterine fibroid   . Hypertension   . 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 Personal history of kidney stones   . Shingles   .  Torn ligament    left ankle  . Uterine polyp     Past Surgical History:  Procedure Laterality Date  . APPENDECTOMY  1983  . BREAST SURGERY Left 2010   adenoma removal, biopsy, tissue removal; benign  . CATARACT EXTRACTION W/ INTRAOCULAR LENS IMPLANT    . CYSTECTOMY Right 1983   right ovary  . DILATATION & CURETTAGE/HYSTEROSCOPY WITH TRUECLEAR N/A 06/24/2013   Procedure: DILATATION & CURETTAGE/HYSTEROSCOPY WITH TRUECLEAR;  Surgeon: Azalia Bilis, MD;  Location: [facility];  Service: Gynecology;  Laterality: N/A;  . DILATION AND CURETTAGE OF UTERUS    . HAND SURGERY    . hysteroscopic resection    . polyp removal    . TUBAL LIGATION Bilateral 1983  . VAGINAL DELIVERY  1969, 1973    MEDS:  Reviewed in EPIC and UTD  ALLERGIES: Contrast media [iodinated diagnostic agents]; Saccharin; Shellfish-derived products; Adhesive [tape]; Atorvastatin; Epinephrine; Iodine; Latex; Nickel; and Statins  Family History  Problem Relation Age of Onset  . COPD Mother        stage 45  . Hypertension Mother 20  . Mitral valve prolapse Mother 42  . Crohn's disease Grandchild        granddaughter  . Mitral valve prolapse Sister 23  . Breast cancer Sister   . Colon cancer Father   . Cancer Paternal Aunt        bladder  . Yves Dill Parkinson White syndrome Paternal Uncle   . Breast cancer Maternal Grandmother   .  [place] White syndrome Paternal Grandfather        ?  Marland Kitchen Yves Dill Parkinson White syndrome Cousin   . Yves Dill Parkinson White syndrome Cousin   . Cancer Paternal Aunt        melanoma  . Cancer Paternal Uncle        blood cancer  . Aneurysm Brother   . Stroke Brother   . AVM Brother     SH:  Married, non smoker  Review of Systems  All other systems reviewed and are negative.   PHYSICAL EXAMINATION:    BP 137/60 (BP Location: Left Arm, Patient Position: Sitting, Cuff Size: Normal)   Pulse 90   Resp 20   Ht 4' 11.75" (1.518 m)   Wt 157 lb 1.6 oz (71.3 kg)   LMP 05/13/2013    BMI 30.94 kg/m     General appearance: alert, cooperative and appears stated age Neck: no adenopathy, supple, symmetrical, trachea midline and thyroid normal to inspection and palpation CV:  Regular rate and rhythm Lungs:  clear to auscultation, no wheezes, rales or rhonchi, symmetric air entry Abdomen: soft, non-tender; bowel sounds normal; no masses,  no organomegaly  Pelvic: External genitalia:  no lesions              Urethra:  normal appearing urethra with no masses, tenderness or lesions              Bartholins and Skenes: normal                 Vagina: normal appearing vagina with normal color and discharge, no lesions, no evidence of bleeding noted vaginally or at cervix              Cervix: no lesions              Bimanual Exam:  Uterus:  normal size, contour, position, consistency, mobility, non-tender              Adnexa: no mass, fullness, tenderness              Rectovaginal: No..  Confirms.              Anus:  normal sphincter tone, no lesions  Endometrial biopsy recommended.  Discussed with patient.  Verbal and written consent obtained.   Procedure:  Speculum placed.  Cervix visualized and cleansed with betadine prep.  A single toothed tenaculum was applied to the anterior lip of the cervix.  Endometrial pipelle was advanced through the cervix into the endometrial cavity without difficulty.  Pipelle passed to 6.5cm.  Suction applied and pipelle removed with scant tissue obtained.  This was performed three additional times until I fel there was adequate tissue for diagnosis.  Pt tolerated procedure well.  Instruments removed.  Recommended ultrasound, which could be done today.   Uterus:  6.8 x 4.3 x 3.4cm with 6 x 26mm and 6 x 93mm intramural fibroids.  (Single fibroid measuring 1.4cm noted 2016) Endometrium:  27 - 3.32mm Left ovary:  1.7 x 1.5  1.1cm Right ovary:  1.3  1.6 x 0.8cm Cul de sac:  No free fluid  Findings reviewed with pt.  Comparisons made to ultrasound from  2016.  Chaperone was present for exam.  Assessment: PMP bleeding Small intramural uterine fibroids  Plan: Endometrial biopsy pathology will be called to pt when finalized and additional recommendations will be made at that time.    ~25 minutes spent with patient >50% of time was in face  to face discussion of above.

## 2017-05-17 ENCOUNTER — Telehealth: Payer: Self-pay

## 2017-05-17 NOTE — Telephone Encounter (Signed)
Spoke with patient. Results given. Patient verbalizes understanding. Will close encounter.

## 2017-05-17 NOTE — Telephone Encounter (Signed)
-----   Message from Megan Salon, MD sent at 05/17/2017  1:11 AM EDT ----- Please let pt know her biopsy did not show any abnormal cells.  There was not a significant amount of tissue in the biopsy but I did the biopsy four separate times due to the small amount of tissue.  So, if she has bleeding again over the next several months, I will want to do a D&C and try and get more tissue for analysis.  Please ask her to call if she has any bleeding.  Thanks.

## 2017-06-28 DIAGNOSIS — Z79899 Other long term (current) drug therapy: Secondary | ICD-10-CM | POA: Insufficient documentation

## 2017-07-26 DIAGNOSIS — E6609 Other obesity due to excess calories: Secondary | ICD-10-CM | POA: Insufficient documentation

## 2017-07-26 DIAGNOSIS — Z6830 Body mass index (BMI) 30.0-30.9, adult: Secondary | ICD-10-CM

## 2018-01-23 DIAGNOSIS — R768 Other specified abnormal immunological findings in serum: Secondary | ICD-10-CM | POA: Insufficient documentation

## 2018-03-06 IMAGING — MR MR BILATERAL BREAST WITHOUT AND WITH CONTRAST
6 of 8 series · 32 of 48 positions shown · IV contrast (Multihance)
Comparison: Previous exams including breast MRI of 12/15/2010.

CLINICAL DATA: Dense breasts. Elevated lifetime risk of breast
cancer. Family history of breast cancer including sister at age 40.
History of left breast atypia status post surgical excision.

LABS:  Creatinine 1.0, GFR 58
EXAM:
BILATERAL BREAST MRI WITH AND WITHOUT CONTRAST
TECHNIQUE: Multiplanar, multisequence MR images of both breasts were obtained
prior to and following the intravenous administration of 14 ml of
MultiHance.

[Series 2: t2_tirm_tra ipat (a-p) · axial · 3.0mm · 0.70mm/px · z∈[-55,+107]mm · 2 of 55 slices shown]
[im 1/55]
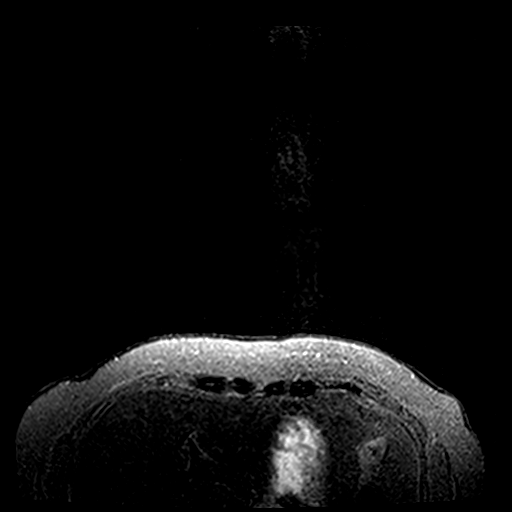
[im 55/55]
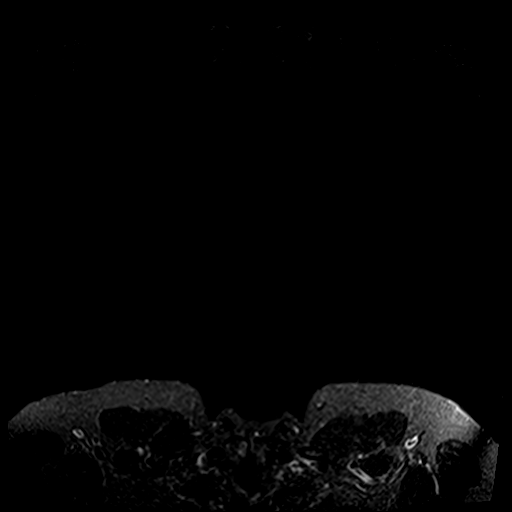

[Series 5: fl3d post-cm 20 · axial · 1.2mm · 0.94mm/px · z∈[-60,+112]mm · 8 of 144 slices shown (1 of 2)]
[im 1/144]
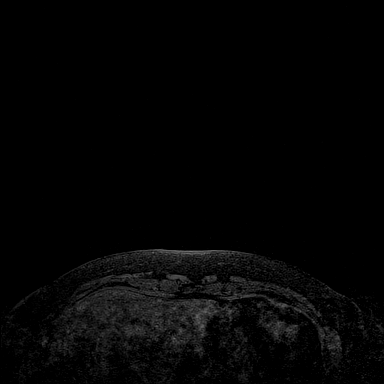
[im 21/144]
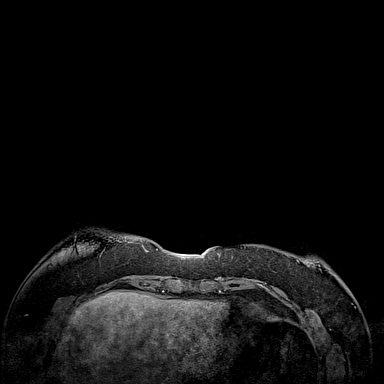
[im 41/144]
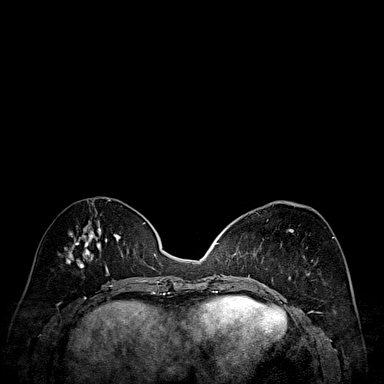
[im 62/144]
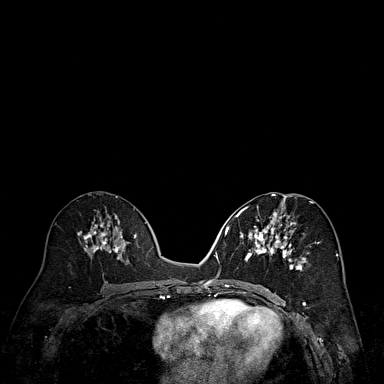
[im 82/144]
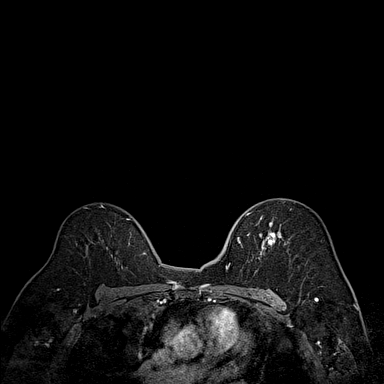
[im 103/144]
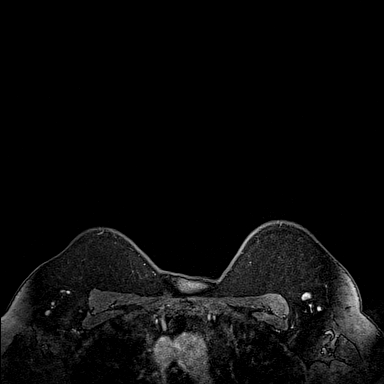
[im 123/144]
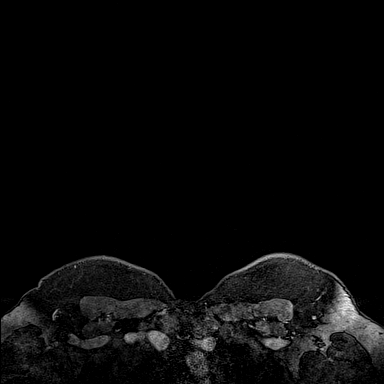
[im 144/144]
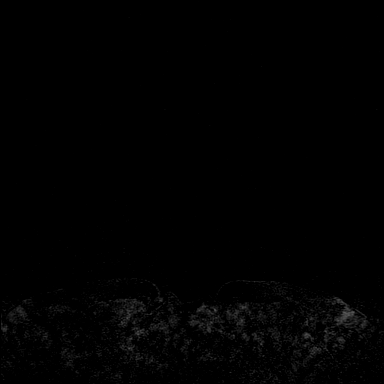

[Series 6: fl3d post-cm 20 · axial · 1.2mm · 0.94mm/px · z∈[-60,+112]mm · 9 of 144 slices shown (2 of 2)]
[im 1/144]
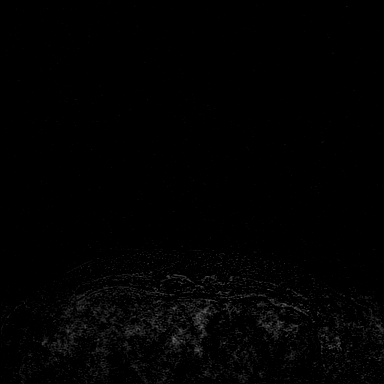
[im 18/144]
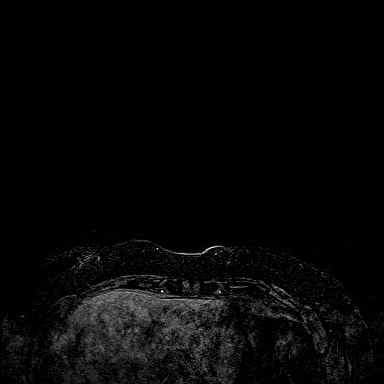
[im 36/144]
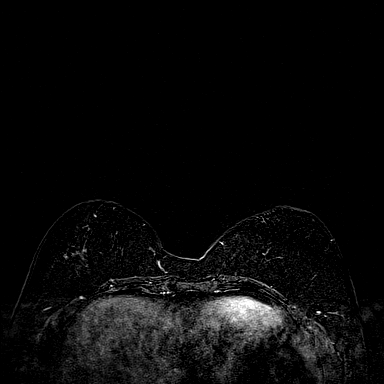
[im 54/144]
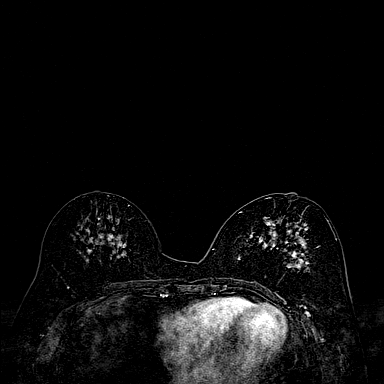
[im 72/144]
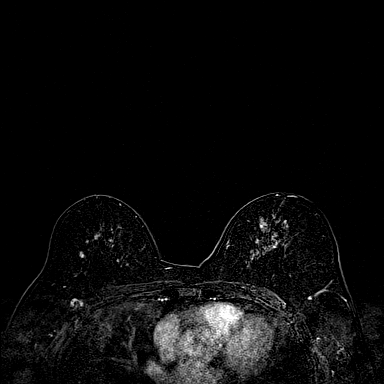
[im 90/144]
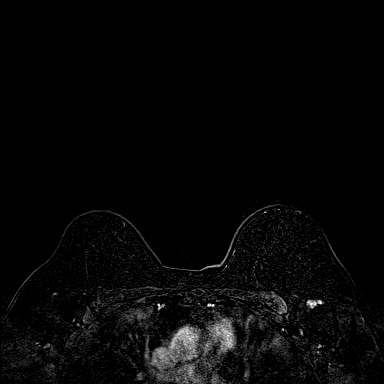
[im 108/144]
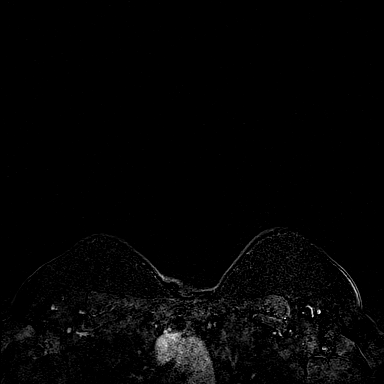
[im 126/144]
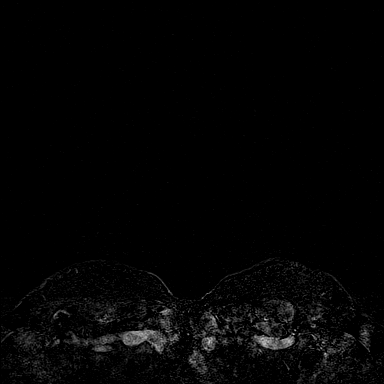
[im 144/144]
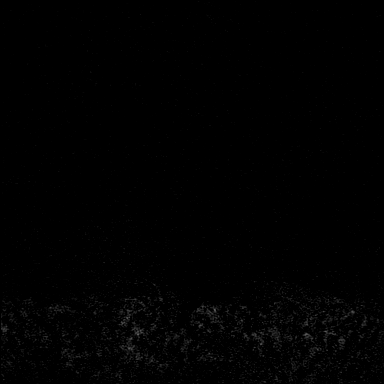

[Series 8: fl3d post-cm 3min · axial · 1.2mm · 0.94mm/px · z∈[-60,+112]mm · 9 of 144 slices shown]
[im 1/144]
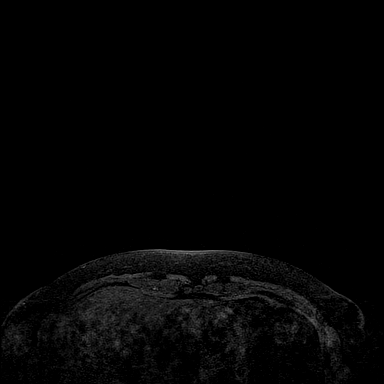
[im 18/144]
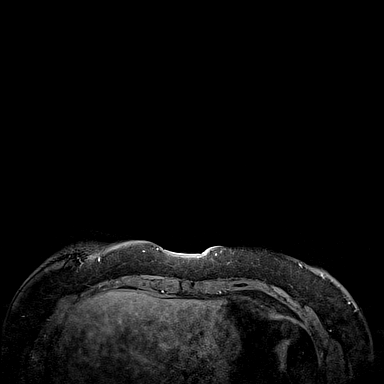
[im 36/144]
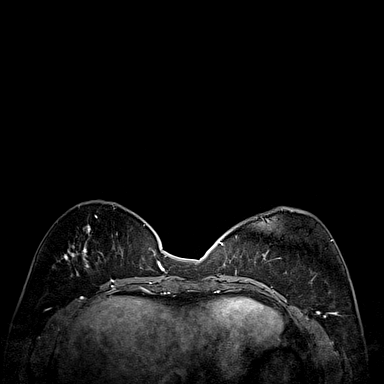
[im 54/144]
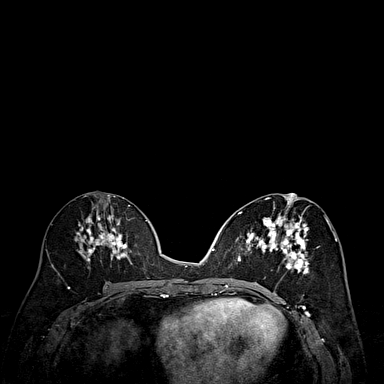
[im 72/144]
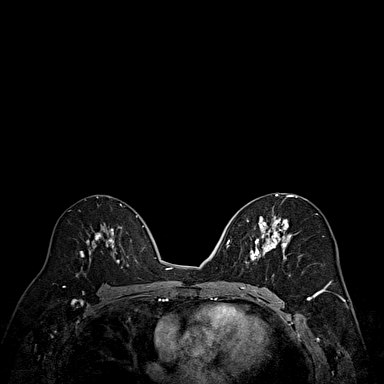
[im 90/144]
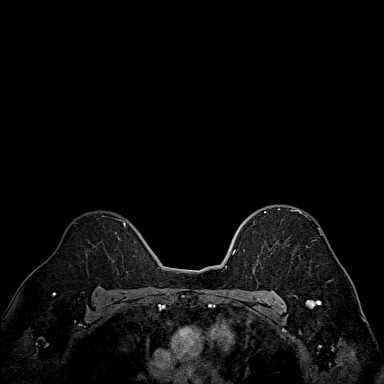
[im 108/144]
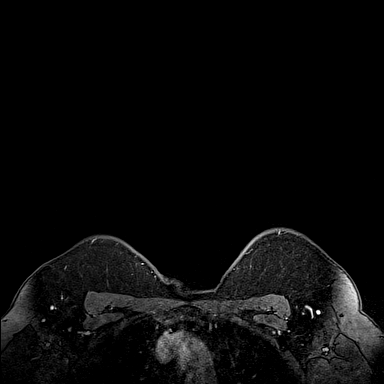
[im 126/144]
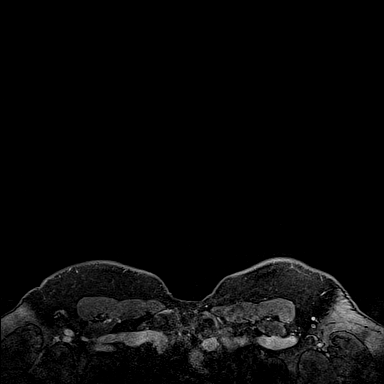
[im 144/144]
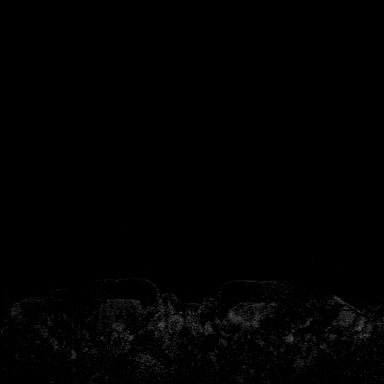

[Series 10: fl3d post-cm 3min_sub_mip_tra · axial · 172.8mm · 0.94mm/px · 1 of 1 slices shown]
[im 1/1]
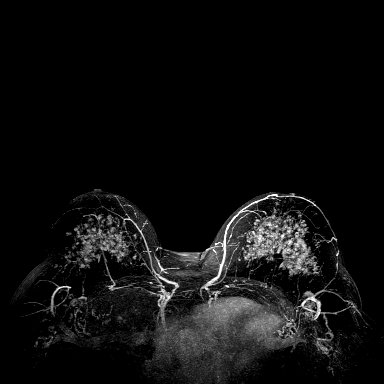

[Series 11: fl3d post-cm 5min · axial · 1.2mm · 0.94mm/px · z∈[-60,-18]mm · 3 of 144 slices shown]
[im 1/144]
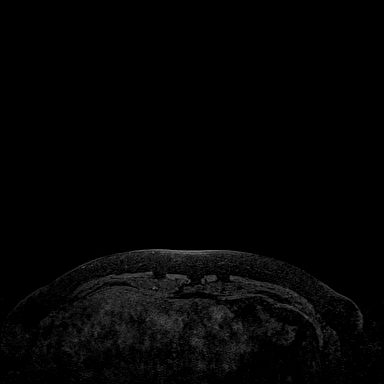
[im 18/144]
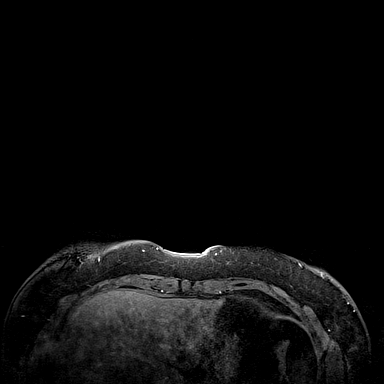
[im 36/144]
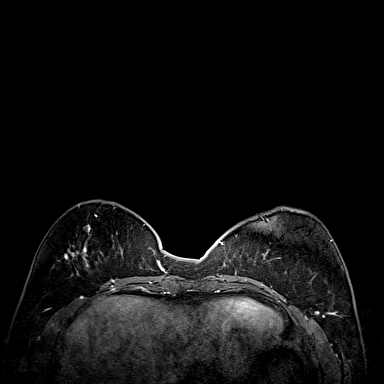

[32 of 48 positions shown; findings below may reference images not displayed]

THREE-DIMENSIONAL MR IMAGE RENDERING ON INDEPENDENT WORKSTATION:

Three-dimensional MR images were rendered by post-processing of the
original MR data on an independent workstation. The
three-dimensional MR images were interpreted, and findings are
reported in the following complete MRI report for this study. Three
dimensional images were evaluated at the independent [employer]
workstation
FINDINGS: Breast composition: c. Heterogeneous fibroglandular tissue.

Background parenchymal enhancement: Marked.

Right breast: Scattered cysts. No suspicious mass, non-mass
enhancement or secondary signs of malignancy.

Left breast: Scattered cysts. No suspicious mass, non-mass
enhancement or secondary signs of malignancy.

Lymph nodes: No abnormal appearing lymph nodes.

Ancillary findings:  None.
IMPRESSION: No evidence of malignancy within either breast.

RECOMMENDATION:
Given patient's elevated lifetime risk of breast cancer and family
history of breast cancer, would consider the addition of annual
screening breast MRI to annual screening mammography. Per American
Cancer Society guidelines, annual screening MRI of the breasts is
recommended if a risk assessment calculation for breast cancer,
preferably using the Tyrer-Cuzick model, measures greater than 20%.

BI-RADS CATEGORY  2: Benign.

## 2018-04-17 ENCOUNTER — Ambulatory Visit: Payer: Medicare Other | Admitting: Certified Nurse Midwife

## 2018-04-17 ENCOUNTER — Encounter: Payer: Self-pay | Admitting: Certified Nurse Midwife

## 2018-04-17 ENCOUNTER — Other Ambulatory Visit: Payer: Self-pay

## 2018-04-17 ENCOUNTER — Other Ambulatory Visit (HOSPITAL_COMMUNITY)
Admission: RE | Admit: 2018-04-17 | Discharge: 2018-04-17 | Disposition: A | Discharge status: Home / Self Care | Payer: Medicare Other | Source: Ambulatory Visit | Attending: Certified Nurse Midwife | Admitting: Certified Nurse Midwife

## 2018-04-17 VITALS — BP 122/76 | HR 70 | Resp 16 | Ht 59.5 in | Wt 155.0 lb

## 2018-04-17 DIAGNOSIS — Z780 Asymptomatic menopausal state: Secondary | ICD-10-CM | POA: Diagnosis not present

## 2018-04-17 DIAGNOSIS — N898 Other specified noninflammatory disorders of vagina: Secondary | ICD-10-CM | POA: Diagnosis not present

## 2018-04-17 DIAGNOSIS — Z124 Encounter for screening for malignant neoplasm of cervix: Secondary | ICD-10-CM

## 2018-04-17 DIAGNOSIS — Z01419 Encounter for gynecological examination (general) (routine) without abnormal findings: Secondary | ICD-10-CM | POA: Diagnosis not present

## 2018-04-17 NOTE — Patient Instructions (Signed)

## 2018-04-17 NOTE — Progress Notes (Signed)
72 y.o. G65P2002 Married  Caucasian Fe here for annual exam. Menopausal no HRT, denies vaginal bleeding. Some vaginal dryness. Seeing  Dr. Dema Severin PCP for aex, labs, Hypertension management, which has increased dosage of medication, tolerating well, also Vitamin D, Cholesterol, Metformin management. All other medications stable. Had mammogram in ? 2/19, all normal per patient. Due for MRI in 8/19.  Has noted some increase in vaginal odor at times and slight irritation. Not sure if infection or just dryness. Would like checked today. No itching or burning noted today. Stress level better with family member now leaving on her own. No other health concerns today.  Patient's last menstrual period was 05/13/2013.          Sexually active: Yes.    The current method of family planning is tubal ligation.   Exercising: No.  exercise Smoker:  no  Health Maintenance: Pap:  04-13-16 neg History of Abnormal Pap: no MMG:  1/18 bilateral & right breast u/s category 2:neg, 7/18 bilateral MRI neg, 2019 waiting on fax Self Breast exams: yes Colonoscopy:  2016 or 2017 f/u 67yrs BMD:   2015 pcp manages TDaP:  2012 Shingles: 2017 Pneumonia: 2018  Hep C and HIV: HIV neg yrs ago, Hep c neg 2017 Labs: with PCP   reports that she has never smoked. She has never used smokeless tobacco. She reports that she does not drink alcohol or use drugs.  Past Medical History:  Diagnosis Date  . Arthritis   . Breast adenoma 2010   benign  . Cataract 3-14  . Diverticulitis 2008  . DVT (deep venous thrombosis) ([place])    oral contraceptive use  . Fibroid   . History of Rocky Mountain spotted fever 1990  . History of uterine fibroid   . Hypertension   . 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 Personal history of kidney stones   . Shingles   . Torn ligament    left ankle  . Uterine polyp     Past Surgical History:  Procedure Laterality Date  . APPENDECTOMY  1983  . BREAST SURGERY Left 2010    adenoma removal, biopsy, tissue removal; benign  . CATARACT EXTRACTION W/ INTRAOCULAR LENS IMPLANT    . CYSTECTOMY Right 1983   right ovary  . DILATATION & CURETTAGE/HYSTEROSCOPY WITH TRUECLEAR N/A 06/24/2013   Procedure: DILATATION & CURETTAGE/HYSTEROSCOPY WITH TRUECLEAR;  Surgeon: Azalia Bilis, MD;  Location: [facility];  Service: Gynecology;  Laterality: N/A;  . DILATION AND CURETTAGE OF UTERUS    . HAND SURGERY    . hysteroscopic resection    . polyp removal    . TUBAL LIGATION Bilateral 1983  . VAGINAL DELIVERY  1969, 1973    Current Outpatient Medications  Medication Sig Dispense Refill  . Calcium Carbonate-Vitamin D (CALCIUM 600 + D PO) Take 1 tablet by mouth daily.     . fluticasone (FLONASE) 50 MCG/ACT nasal spray Place 2 sprays into the nose daily as needed for rhinitis.    . furosemide (LASIX) 20 MG tablet Take 20 mg by mouth 3 (three) times a week.     . losartan (COZAAR) 100 MG tablet Take 100 mg by mouth at bedtime.     . metFORMIN (GLUCOPHAGE) 500 MG tablet Take 500 mg by mouth daily.     . metoprolol succinate (TOPROL-XL) 25 MG 24 hr tablet Take 50 mg by mouth daily.     . Multiple Vitamin (MULTIVITAMIN) tablet Take 1 tablet by  mouth daily.    . Naproxen Sodium (ALEVE) 220 MG CAPS Take by mouth.    . Omega-3 Fatty Acids (FISH OIL) 1000 MG CAPS Take 1,200 mg by mouth.    . Polyvinyl Alcohol-Povidone (REFRESH OP) Apply to eye.    . Probiotic Product (PROBIOTIC PO) Take by mouth daily.     No current facility-administered medications for this visit.     Family History  Problem Relation Age of Onset  . COPD Mother        stage 13  . Hypertension Mother 66  . Mitral valve prolapse Mother 9  . Crohn's disease Grandchild        granddaughter  . Mitral valve prolapse Sister 42  . Breast cancer Sister   . Colon cancer Father   . Cancer Paternal Aunt        bladder  . Yves Dill Parkinson White syndrome Paternal Uncle   . Breast cancer Maternal Grandmother   . [place] White syndrome Paternal Grandfather        ?  Marland Kitchen Yves Dill Parkinson White syndrome Cousin   . Yves Dill Parkinson White syndrome Cousin   . Cancer Paternal Aunt        melanoma  . Cancer Paternal Uncle        blood cancer  . Aneurysm Brother   . Stroke Brother   . AVM Brother     ROS:  Pertinent items are noted in HPI.  Otherwise, a comprehensive ROS was negative.  Exam:   BP 122/76   Pulse 70   Resp 16   Ht 4' 11.5" (1.511 m)   Wt 155 lb (70.3 kg)   LMP 05/13/2013   BMI 30.78 kg/m  Height: 4' 11.5" (151.1 cm) Ht Readings from Last 3 Encounters:  04/17/18 4' 11.5" (1.511 m)  05/14/17 4' 11.75" (1.518 m)  04/16/17 4' 11.75" (1.518 m)    General appearance: alert, cooperative and appears stated age Head: Normocephalic, without obvious abnormality, atraumatic Neck: no adenopathy, supple, symmetrical, trachea midline and thyroid normal to inspection and palpation Lungs: clear to auscultation bilaterally Breasts: normal appearance, no masses or tenderness, No nipple retraction or dimpling, No nipple discharge or bleeding, No axillary or supraclavicular adenopathy Heart: regular rate and rhythm Abdomen: soft, non-tender; no masses,  no organomegaly Extremities: extremities normal, atraumatic, no cyanosis or edema Skin: Skin color, texture, turgor normal. No rashes or lesions Lymph nodes: Cervical, supraclavicular, and axillary nodes normal. No abnormal inguinal nodes palpated Neurologic: Grossly normal   Pelvic: External genitalia:  no lesions, atrophic appearance              Urethra:  normal appearing urethra with no masses, tenderness or lesions              Bartholin's and Skene's: normal                 Vagina: normal appearing vagina with normal color and discharge, no lesions, vaginal dryness and atrophy noted at introitus only              Cervix: no cervical motion tenderness and no lesions              Pap taken: Yes.   Bimanual Exam:  Uterus:  normal size,  contour, position, consistency, mobility, non-tender and anteverted              Adnexa: normal adnexa and no mass, fullness, tenderness  Rectovaginal: Confirms               Anus:  normal sphincter tone, no lesions  Chaperone present: yes  A:  Well Woman with normal exam  Post menopausal no HRT.  History of breast atypia post surgical excision in past and sister with Breast cancer at age 13, due for MRI due to increase lifetime risk  Vaginal odor ? Dryness related  Hypertension/glucose/Vitamin D management with PCP all stable per patient now  P:   Reviewed health and wellness pertinent to exam  Aware of need to advise if vaginal bleeding  Will send request to Triage to schedule patient as needed.  Discussed vaginal dryness finding and OTC use of coconut or Olive oil or replens if needed twice weekly. Questions addressed.  Lab: Affirm will treat if indicated  Continue follow up with PCP as indicated  Pap smear: yes   counseled on breast self exam, mammography screening, feminine hygiene, menopause, adequate intake of calcium and vitamin D, diet and exercise  return annually or prn  An After Visit Summary was printed and given to the patient.

## 2018-04-18 ENCOUNTER — Telehealth: Payer: Self-pay

## 2018-04-18 LAB — VAGINITIS/VAGINOSIS, DNA PROBE
CANDIDA SPECIES: NEGATIVE
GARDNERELLA VAGINALIS: POSITIVE — AB
Trichomonas vaginosis: NEGATIVE

## 2018-04-18 NOTE — Telephone Encounter (Signed)
-----   Message from Regina Eck, CNM sent at 04/18/2018  7:51 AM EDT ----- Notify patient that her vaginal screening was positive for BV only. Yeast and trichomonas negative. She can do either Flagyl 500 mg bid x 7 or Metrogel BID x 5

## 2018-04-18 NOTE — Telephone Encounter (Signed)
lmtcb

## 2018-04-21 LAB — CYTOLOGY - PAP
DIAGNOSIS: NEGATIVE
HPV: NOT DETECTED

## 2018-04-21 MED ORDER — METRONIDAZOLE 500 MG PO TABS: 500.0000 mg | ORAL_TABLET | Freq: Two times a day (BID) | ORAL | 0 refills | Status: DC

## 2018-04-21 NOTE — Telephone Encounter (Signed)
Patient is returning a call to [place]. I informed patient Marie Norris is out of the office and the triage nurse would return her call.

## 2018-04-21 NOTE — Telephone Encounter (Signed)
Spoke with patient, advised as seen below per Melvia Heaps, CNM. Rx for Flagyl po to verified pharmacy. ETOH precautions reviewed. Patient verbalizes understanding and is agreeable. Encounter closed.

## 2018-04-22 ENCOUNTER — Telehealth: Payer: Self-pay | Admitting: *Deleted

## 2018-04-22 DIAGNOSIS — Z1231 Encounter for screening mammogram for malignant neoplasm of breast: Secondary | ICD-10-CM

## 2018-04-22 DIAGNOSIS — Z9189 Other specified personal risk factors, not elsewhere classified: Secondary | ICD-10-CM

## 2018-04-22 DIAGNOSIS — Z872 Personal history of diseases of the skin and subcutaneous tissue: Secondary | ICD-10-CM

## 2018-04-22 DIAGNOSIS — R922 Inconclusive mammogram: Secondary | ICD-10-CM

## 2018-04-22 DIAGNOSIS — Z803 Family history of malignant neoplasm of breast: Secondary | ICD-10-CM

## 2018-04-22 NOTE — Telephone Encounter (Signed)
Order placed for bilateral breast MRI with and without contrast, schedule for Augist 2019, per Melvia Heaps, CNM request. See [facility] MMG results dated 11/25/17.   Patient notified, patient aware will be called by [facility] directly to schedule. Our office will precert MRI. Patient verbalizes understanding and is agreeable.   MMG results to scan.  Routing to provider for final review. Patient is agreeable to disposition. Will close encounter.  Cc: Melvia Heaps, CNM

## 2018-06-10 ENCOUNTER — Encounter: Payer: Self-pay | Admitting: Certified Nurse Midwife

## 2018-06-18 ENCOUNTER — Ambulatory Visit
Admission: RE | Admit: 2018-06-18 | Discharge: 2018-06-18 | Disposition: A | Discharge status: Home / Self Care | Payer: Medicare Other | Source: Ambulatory Visit | Attending: Obstetrics & Gynecology | Admitting: Obstetrics & Gynecology

## 2018-06-18 DIAGNOSIS — Z872 Personal history of diseases of the skin and subcutaneous tissue: Secondary | ICD-10-CM

## 2018-06-18 DIAGNOSIS — R922 Inconclusive mammogram: Secondary | ICD-10-CM

## 2018-06-18 DIAGNOSIS — Z803 Family history of malignant neoplasm of breast: Secondary | ICD-10-CM

## 2018-06-18 DIAGNOSIS — Z9189 Other specified personal risk factors, not elsewhere classified: Secondary | ICD-10-CM

## 2018-06-18 MED ORDER — GADOBENATE DIMEGLUMINE 529 MG/ML IV SOLN
15.0000 mL | Freq: Once | INTRAVENOUS | Status: AC | PRN
  Administered 2018-06-18: 15 mL via INTRAVENOUS

## 2018-06-19 ENCOUNTER — Encounter: Payer: Self-pay | Admitting: Genetics

## 2018-06-23 ENCOUNTER — Telehealth: Payer: Self-pay

## 2018-06-23 NOTE — Telephone Encounter (Signed)
-----   Message from Salvadore Dom, MD sent at 06/18/2018  5:42 PM EDT ----- Results and any recommendations were sent via [place]. Needs f/u mri in one year  Hi Ms Jobson, Your breast MRI was normal. You should have a repeat MRI in one year and continue with yearly mammograms. Please call the office with any concerns. Sumner Boast, MD (covering for Dr Sabra Heck)

## 2018-06-23 NOTE — Telephone Encounter (Signed)
Left message to call Kaitlyn at [phone]. 

## 2018-06-25 NOTE — Telephone Encounter (Signed)
Spoke with patient. Advised of results as seen below from [place]. Patient verbalizes understanding. Patient states that she received a message from a genetics counselor on MyChart regarding genetic testing. States that she had this completed years ago with [facility] and her BRCA testing for 1 and 2 was negative. Asking if she needs any additional genetic testing? Advised will review with Dr.Miller and return call.

## 2018-06-27 NOTE — Telephone Encounter (Signed)
There are additional genes that are begin tested for genetically linked breast and ovarian cancers--in addition to BRCA1 and 2.  If she has an interest in doing additional testing, she should see the geneticists.  This is now where we are sending all of our patients for genetic testing.

## 2018-06-30 NOTE — Telephone Encounter (Signed)
Left detailed message per DPR that patient should contact geneticists if she has any further questions on genetic testing.

## 2018-11-26 ENCOUNTER — Encounter: Payer: Self-pay | Admitting: Obstetrics & Gynecology

## 2019-03-26 DIAGNOSIS — Z789 Other specified health status: Secondary | ICD-10-CM | POA: Insufficient documentation

## 2019-04-21 ENCOUNTER — Other Ambulatory Visit: Payer: Self-pay

## 2019-04-21 NOTE — Progress Notes (Deleted)
73 y.o. [id number] Married  {Race/ethnicity:17218} Fe here for annual exam.    Patient's last menstrual period was 05/13/2013.          Sexually active: {yes no:314532}  The current method of family planning is tubal ligation.    Exercising: {yes no:314532}  {types:19826} Smoker:  {YES NO:22349}  ROS  Health Maintenance: Pap:  04-13-16 neg, 04-17-18 neg HPV HR neg History of Abnormal Pap: {YES NO:22349} MMG:  11-26-2018 category c density birads 1:neg Self Breast exams: {YES NO:22349} Colonoscopy:  2016 or 2017 f/u 80yrs BMD:   2015 pcp manages TDaP:  2012 Shingles: 2017 Pneumonia: 2018 Hep C and HIV: HIV neg yrs ago, Hep c neg 2017 Labs: ***   reports that she has never smoked. She has never used smokeless tobacco. She reports that she does not drink alcohol or use drugs.  Past Medical History:  Diagnosis Date  . Arthritis   . Breast adenoma 2010   benign  . Cataract 3-14  . Diverticulitis 2008  . DVT (deep venous thrombosis) ([place])    oral contraceptive use  . Fibroid   . History of Rocky Mountain spotted fever 1990  . History of uterine fibroid   . Hypertension   . 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 Personal history of kidney stones   . Shingles   . Torn ligament    left ankle  . Uterine polyp     Past Surgical History:  Procedure Laterality Date  . APPENDECTOMY  1983  . BREAST SURGERY Left 2010   adenoma removal, biopsy, tissue removal; benign  . CATARACT EXTRACTION W/ INTRAOCULAR LENS IMPLANT    . CYSTECTOMY Right 1983   right ovary  . DILATATION & CURETTAGE/HYSTEROSCOPY WITH TRUECLEAR N/A 06/24/2013   Procedure: DILATATION & CURETTAGE/HYSTEROSCOPY WITH TRUECLEAR;  Surgeon: Azalia Bilis, MD;  Location: [facility];  Service: Gynecology;  Laterality: N/A;  . DILATION AND CURETTAGE OF UTERUS    . HAND SURGERY    . hysteroscopic resection    . polyp removal    . TUBAL LIGATION Bilateral 1983  . VAGINAL DELIVERY  1969, 1973     Current Outpatient Medications  Medication Sig Dispense Refill  . Calcium Carbonate-Vitamin D (CALCIUM 600 + D PO) Take 1 tablet by mouth daily.     . fluticasone (FLONASE) 50 MCG/ACT nasal spray Place 2 sprays into the nose daily as needed for rhinitis.    . furosemide (LASIX) 20 MG tablet Take 20 mg by mouth 3 (three) times a week.     . losartan (COZAAR) 100 MG tablet Take 100 mg by mouth at bedtime.     . metFORMIN (GLUCOPHAGE) 500 MG tablet Take 500 mg by mouth daily.     . metoprolol succinate (TOPROL-XL) 25 MG 24 hr tablet Take 50 mg by mouth daily.     . metroNIDAZOLE (FLAGYL) 500 MG tablet Take 1 tablet (500 mg total) by mouth 2 (two) times daily. 14 tablet 0  . Multiple Vitamin (MULTIVITAMIN) tablet Take 1 tablet by mouth daily.    . Naproxen Sodium (ALEVE) 220 MG CAPS Take by mouth.    . Omega-3 Fatty Acids (FISH OIL) 1000 MG CAPS Take 1,200 mg by mouth.    . Polyvinyl Alcohol-Povidone (REFRESH OP) Apply to eye.    . Probiotic Product (PROBIOTIC PO) Take by mouth daily.     No current facility-administered medications for this visit.     Family History  Problem  Relation Age of Onset  . COPD Mother        stage 8  . Hypertension Mother 19  . Mitral valve prolapse Mother 32  . Crohn's disease Grandchild        granddaughter  . Mitral valve prolapse Sister 61  . Breast cancer Sister   . Colon cancer Father   . Cancer Paternal Aunt        bladder  . Yves Dill Parkinson White syndrome Paternal Uncle   . Breast cancer Maternal Grandmother   . [place] White syndrome Paternal Grandfather        ?  Marland Kitchen Yves Dill Parkinson White syndrome Cousin   . Yves Dill Parkinson White syndrome Cousin   . Cancer Paternal Aunt        melanoma  . Cancer Paternal Uncle        blood cancer  . Aneurysm Brother   . Stroke Brother   . AVM Brother     ROS:  Pertinent items are noted in HPI.  Otherwise, a comprehensive ROS was negative.  Exam:   LMP 05/13/2013    Ht Readings from Last 3  Encounters:  04/17/18 4' 11.5" (1.511 m)  05/14/17 4' 11.75" (1.518 m)  04/16/17 4' 11.75" (1.518 m)    General appearance: alert, cooperative and appears stated age Head: Normocephalic, without obvious abnormality, atraumatic Neck: no adenopathy, supple, symmetrical, trachea midline and thyroid {EXAM; THYROID:18604} Lungs: clear to auscultation bilaterally Breasts: {Exam; breast:13139::"normal appearance, no masses or tenderness"} Heart: regular rate and rhythm Abdomen: soft, non-tender; no masses,  no organomegaly Extremities: extremities normal, atraumatic, no cyanosis or edema Skin: Skin color, texture, turgor normal. No rashes or lesions Lymph nodes: Cervical, supraclavicular, and axillary nodes normal. No abnormal inguinal nodes palpated Neurologic: Grossly normal   Pelvic: External genitalia:  no lesions              Urethra:  normal appearing urethra with no masses, tenderness or lesions              Bartholin's and Skene's: normal                 Vagina: normal appearing vagina with normal color and discharge, no lesions              Cervix: {exam; cervix:14595}              Pap taken: {yes no:314532} Bimanual Exam:  Uterus:  {exam; uterus:12215}              Adnexa: {exam; adnexa:12223}               Rectovaginal: Confirms               Anus:  normal sphincter tone, no lesions  Chaperone present: ***  A:  Well Woman with normal exam  P:   Reviewed health and wellness pertinent to exam  Pap smear: {YES NO:22349}  {plan; gyn:5269::"mammogram","pap smear","return annually or prn"}  An After Visit Summary was printed and given to the patient.

## 2019-04-22 ENCOUNTER — Ambulatory Visit: Payer: Medicare Other | Admitting: Certified Nurse Midwife

## 2019-05-13 ENCOUNTER — Ambulatory Visit (INDEPENDENT_AMBULATORY_CARE_PROVIDER_SITE_OTHER): Payer: Medicare Other | Admitting: Certified Nurse Midwife

## 2019-05-13 ENCOUNTER — Other Ambulatory Visit: Payer: Self-pay

## 2019-05-13 ENCOUNTER — Encounter: Payer: Self-pay | Admitting: Certified Nurse Midwife

## 2019-05-13 VITALS — BP 120/70 | HR 70 | Temp 97.2°F | Resp 16 | Ht 59.75 in | Wt 158.0 lb

## 2019-05-13 DIAGNOSIS — Z01419 Encounter for gynecological examination (general) (routine) without abnormal findings: Secondary | ICD-10-CM | POA: Diagnosis not present

## 2019-05-13 DIAGNOSIS — Z124 Encounter for screening for malignant neoplasm of cervix: Secondary | ICD-10-CM

## 2019-05-13 NOTE — Progress Notes (Signed)
73 y.o. G1P2002 Married  Caucasian Fe here for annual exam. Menopausal no HRT. Denies vaginal bleeding, some vaginal dryness but no issues. Sees PCP for cholesterol, hypertension, diabetes management and labs.Marland Kitchen Spouse diagnosed with [facility] Spotted Fever, hospitalization at [place] now. Had mammogram earlier in year and aware she will need MRI in 6 months. Still working on weight loss with walking. No health issues today.  Patient's last menstrual period was 05/13/2013.          Sexually active: Yes.    The current method of family planning is tubal ligation.    Exercising: Yes.    some Smoker:  no  Review of Systems  Constitutional: Negative.   HENT: Negative.   Eyes: Negative.   Respiratory: Negative.   Cardiovascular: Negative.   Gastrointestinal: Negative.   Genitourinary: Negative.   Musculoskeletal: Negative.   Skin: Negative.   Neurological: Negative.   Endo/Heme/Allergies: Negative.   Psychiatric/Behavioral: Negative.     Health Maintenance: Pap:  04-17-18 neg HPV HR neg History of Abnormal Pap: no MMG:  11-26-2018 category c density birads 1:neg Self Breast exams: yes Colonoscopy:  2016 or 2017 f/u 39yrs BMD:   2020 pcp manages TDaP:  2020 Shingles: 2017 Pneumonia: 2018 Hep C and HIV: HIV neg yrs ago, Hep c neg 2017 Labs: if needed   reports that she has never smoked. She has never used smokeless tobacco. She reports that she does not drink alcohol or use drugs.  Past Medical History:  Diagnosis Date  . Arthritis   . Breast adenoma 2010   benign  . Cataract 3-14  . Diverticulitis 2008  . DVT (deep venous thrombosis) ([place])    oral contraceptive use  . Fibroid   . History of Rocky Mountain spotted fever 1990  . History of uterine fibroid   . Hypertension   . 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 Personal history of kidney stones   . Shingles   . Torn ligament    left ankle  . Uterine polyp     Past  Surgical History:  Procedure Laterality Date  . APPENDECTOMY  1983  . BREAST SURGERY Left 2010   adenoma removal, biopsy, tissue removal; benign  . CATARACT EXTRACTION W/ INTRAOCULAR LENS IMPLANT    . CYSTECTOMY Right 1983   right ovary  . DILATATION & CURETTAGE/HYSTEROSCOPY WITH TRUECLEAR N/A 06/24/2013   Procedure: DILATATION & CURETTAGE/HYSTEROSCOPY WITH TRUECLEAR;  Surgeon: Azalia Bilis, MD;  Location: [facility];  Service: Gynecology;  Laterality: N/A;  . DILATION AND CURETTAGE OF UTERUS    . HAND SURGERY    . hysteroscopic resection    . polyp removal    . TUBAL LIGATION Bilateral 1983  . VAGINAL DELIVERY  1969, 1973    Current Outpatient Medications  Medication Sig Dispense Refill  . Calcium Carbonate-Vitamin D (CALCIUM 600 + D PO) Take 1 tablet by mouth daily.     Marland Kitchen ezetimibe (ZETIA) 10 MG tablet Take 10 mg by mouth daily.    . fluticasone (FLONASE) 50 MCG/ACT nasal spray Place 2 sprays into the nose daily as needed for rhinitis.    . furosemide (LASIX) 20 MG tablet Take 20 mg by mouth 3 (three) times a week.     . Garlic 0034 MG TBEC     . losartan (COZAAR) 100 MG tablet Take 100 mg by mouth at bedtime.     . metFORMIN (GLUCOPHAGE) 500 MG tablet Take 500 mg by  mouth daily.     . metoprolol succinate (TOPROL-XL) 25 MG 24 hr tablet Take 50 mg by mouth daily.     . Multiple Vitamin (MULTIVITAMIN) tablet Take 1 tablet by mouth daily.    . Naproxen Sodium (ALEVE) 220 MG CAPS Take by mouth.    . Omega-3 Fatty Acids (FISH OIL) 1000 MG CAPS Take 1,200 mg by mouth.    . Polyvinyl Alcohol-Povidone (REFRESH OP) Apply to eye.    . Probiotic Product (PROBIOTIC PO) Take by mouth daily.    . Turmeric 500 MG CAPS      No current facility-administered medications for this visit.     Family History  Problem Relation Age of Onset  . COPD Mother        stage 37  . Hypertension Mother 60  . Mitral valve prolapse Mother 82  . Crohn's disease Grandchild        granddaughter  . Mitral  valve prolapse Sister 55  . Breast cancer Sister   . Colon cancer Father   . Cancer Paternal Aunt        bladder  . Yves Dill Parkinson White syndrome Paternal Uncle   . Breast cancer Maternal Grandmother   . [place] White syndrome Paternal Grandfather        ?  Marland Kitchen Yves Dill Parkinson White syndrome Cousin   . Yves Dill Parkinson White syndrome Cousin   . Cancer Paternal Aunt        melanoma  . Cancer Paternal Uncle        blood cancer  . Aneurysm Brother   . Stroke Brother   . AVM Brother     ROS:  Pertinent items are noted in HPI.  Otherwise, a comprehensive ROS was negative.  Exam:   BP 120/70   Pulse 70   Temp (!) 97.2 F (36.2 C) (Skin)   Resp 16   Ht 4' 11.75" (1.518 m)   Wt 158 lb (71.7 kg)   LMP 05/13/2013   BMI 31.12 kg/m  Height: 4' 11.75" (151.8 cm) Ht Readings from Last 3 Encounters:  05/13/19 4' 11.75" (1.518 m)  04/17/18 4' 11.5" (1.511 m)  05/14/17 4' 11.75" (1.518 m)    General appearance: alert, cooperative and appears stated age Head: Normocephalic, without obvious abnormality, atraumatic Neck: no adenopathy, supple, symmetrical, trachea midline and thyroid normal to inspection and palpation Lungs: clear to auscultation bilaterally Breasts: normal appearance, no masses or tenderness, No nipple retraction or dimpling, No nipple discharge or bleeding, No axillary or supraclavicular adenopathy Heart: regular rate and rhythm Abdomen: soft, non-tender; no masses,  no organomegaly Extremities: extremities normal, atraumatic, no cyanosis or edema Skin: Skin color, texture, turgor normal. No rashes or lesions Lymph nodes: Cervical, supraclavicular, and axillary nodes normal. No abnormal inguinal nodes palpated Neurologic: Grossly normal   Pelvic: External genitalia:  no lesions, normal female, atrophic appearance              Urethra:  normal appearing urethra with no masses, tenderness or lesions              Bartholin's and Skene's: normal                  Vagina: normal appearing vagina with normal color and discharge, no lesions              Cervix: no cervical motion tenderness, no lesions and normal appearance              Pap taken:  No. Bimanual Exam:  Uterus:  normal size, contour, position, consistency, mobility, non-tender and anteverted              Adnexa: normal adnexa and no mass, fullness, tenderness               Rectovaginal: Confirms               Anus:  normal sphincter tone, no lesions  Chaperone present: yes  A:  Well Woman with normal exam  Post menopausal no HRT  History of increased lifetime risk of breast cancer and will need MRI yearly, last one 06/20/18  Hypertension, cholesterol and diabetes management with PCP  Social stress with spouse hospitalization  P:   Reviewed health and wellness pertinent to exam  Aware of need to advise if vaginal bleeding  Discussed MRI needed each year, she will be called after scheduled  Continue follow up with PCP as indicated  Seek friends and family support as needed  Pap smear: no   Reviewed health and wellness recommendation with diet, exercise, Vitamin D intake and calcium, SBE.    RV aex, prn  An After Visit Summary was printed and given to the patient.

## 2019-05-14 ENCOUNTER — Telehealth: Payer: Self-pay

## 2019-05-14 DIAGNOSIS — Z1231 Encounter for screening mammogram for malignant neoplasm of breast: Secondary | ICD-10-CM

## 2019-05-14 NOTE — Telephone Encounter (Signed)
-----   Message from Regina Eck, CNM sent at 05/13/2019  2:53 PM EDT -----  has increased lifetime risk of  breast cancer and needs another MRI scheduled for  06/2019. She is aware she will be called with information.

## 2019-05-14 NOTE — Telephone Encounter (Signed)
Sent to Suzy to precert for breast MRI--see family history.

## 2019-05-25 NOTE — Telephone Encounter (Signed)
Patient is scheduled for a breast MRI on 08/14/2019 with [facility]. Will completed pre-certification process closer to appointment date.   Routing to Melvia Heaps, CNM for final review. Will close encounter  cc: Marisa Sprinkles, CMA

## 2019-08-14 ENCOUNTER — Ambulatory Visit
Admission: RE | Admit: 2019-08-14 | Discharge: 2019-08-14 | Disposition: A | Discharge status: Home / Self Care | Payer: Medicare Other | Source: Ambulatory Visit | Attending: Certified Nurse Midwife | Admitting: Certified Nurse Midwife

## 2019-08-14 DIAGNOSIS — Z1231 Encounter for screening mammogram for malignant neoplasm of breast: Secondary | ICD-10-CM

## 2019-08-14 MED ORDER — GADOBUTROL 1 MMOL/ML IV SOLN
9.0000 mL | Freq: Once | INTRAVENOUS | Status: AC | PRN
  Administered 2019-08-14: 9 mL via INTRAVENOUS

## 2019-08-17 ENCOUNTER — Other Ambulatory Visit: Payer: Self-pay | Admitting: *Deleted

## 2019-08-17 DIAGNOSIS — Z9189 Other specified personal risk factors, not elsewhere classified: Secondary | ICD-10-CM

## 2019-08-17 DIAGNOSIS — Z1231 Encounter for screening mammogram for malignant neoplasm of breast: Secondary | ICD-10-CM

## 2019-08-17 DIAGNOSIS — Z803 Family history of malignant neoplasm of breast: Secondary | ICD-10-CM

## 2019-08-17 DIAGNOSIS — Z1239 Encounter for other screening for malignant neoplasm of breast: Secondary | ICD-10-CM

## 2019-10-27 DIAGNOSIS — M7061 Trochanteric bursitis, right hip: Secondary | ICD-10-CM | POA: Insufficient documentation

## 2020-01-26 ENCOUNTER — Encounter: Payer: Self-pay | Admitting: Certified Nurse Midwife

## 2020-05-13 ENCOUNTER — Ambulatory Visit: Payer: Medicare Other | Admitting: Certified Nurse Midwife

## 2020-06-15 NOTE — Progress Notes (Signed)
74 y.o. G37P2002 Married White or Caucasian Not Hispanic or Latino female here for annual exam.  Patient states that sometimes she smells a vaginal odor. The odor has been intermittent for the last few years. She does notice some increase vaginal discharge. Occasional vaginal itching.   She had BV in 6/19.   No vaginal bleeding. No bowel c/o.  She has mild GSI with severe coughing, leaks small amounts.   Occasionally sexually active, no pain.     She has an elevated risk of breast cancer of 29% and has been getting yearly MRI's. She has had negative genetic testing.   Patient's last menstrual period was 05/13/2013.          Sexually active: Yes.    The current method of family planning is post menopausal status.    Exercising: No.  The patient does not participate in regular exercise at present. Smoker:  no  Health Maintenance: Pap:  04/17/18 Neg Hr Hpv Neg 04/13/16 WNL History of abnormal Pap:  no MMG: 1/25/21density C Bi-rads 1 neg  Breast MRI in 10/20: normal BMD:   2020 pcp manages Colonoscopy:  2016 or 2017 f/u 76yrs TDaP:  04/07/2019 Gardasil: na   reports that she has never smoked. She has never used smokeless tobacco. She reports that she does not drink alcohol and does not use drugs. 2 kids, 2 grandchildren, 2 great grandchildren and one on the way.   Past Medical History:  Diagnosis Date  . Arthritis   . Breast adenoma 2010   benign  . Cataract 3-14  . Diverticulitis 2008  . DVT (deep venous thrombosis) ([place])    oral contraceptive use  . Fibroid   . History of Rocky Mountain spotted fever 1990  . History of uterine fibroid   . Hypertension   . 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 Personal history of kidney stones   . Shingles   . Torn ligament    left ankle  . Uterine polyp     Past Surgical History:  Procedure Laterality Date  . APPENDECTOMY  1983  . BREAST SURGERY Left 2010   adenoma removal, biopsy, tissue removal; benign  .  CATARACT EXTRACTION W/ INTRAOCULAR LENS IMPLANT    . CYSTECTOMY Right 1983   right ovary  . DILATATION & CURETTAGE/HYSTEROSCOPY WITH TRUECLEAR N/A 06/24/2013   Procedure: DILATATION & CURETTAGE/HYSTEROSCOPY WITH TRUECLEAR;  Surgeon: Azalia Bilis, MD;  Location: [facility];  Service: Gynecology;  Laterality: N/A;  . DILATION AND CURETTAGE OF UTERUS    . HAND SURGERY    . hysteroscopic resection    . polyp removal    . TUBAL LIGATION Bilateral 1983  . VAGINAL DELIVERY  1969, 1973    Current Outpatient Medications  Medication Sig Dispense Refill  . Calcium Carbonate-Vitamin D (CALCIUM 600 + D PO) Take 1 tablet by mouth daily.     . Evolocumab 140 MG/ML SOSY Inject into the skin.    . fluticasone (FLONASE) 50 MCG/ACT nasal spray Place 2 sprays into the nose daily as needed for rhinitis.    . furosemide (LASIX) 20 MG tablet Take 20 mg by mouth 3 (three) times a week.     . Garlic 1610 MG TBEC     . losartan (COZAAR) 100 MG tablet Take 100 mg by mouth at bedtime.     . metFORMIN (GLUCOPHAGE) 500 MG tablet Take 500 mg by mouth daily.     . metoprolol succinate (TOPROL-XL) 25  MG 24 hr tablet Take 50 mg by mouth daily.     . Multiple Vitamin (MULTIVITAMIN) tablet Take 1 tablet by mouth daily.    . Naproxen Sodium (ALEVE) 220 MG CAPS Take by mouth.    . Omega-3 Fatty Acids (FISH OIL) 1000 MG CAPS Take 1,200 mg by mouth.    . Polyvinyl Alcohol-Povidone (REFRESH OP) Apply to eye.    . Probiotic Product (PROBIOTIC PO) Take by mouth daily.    . Turmeric 500 MG CAPS      No current facility-administered medications for this visit.    Family History  Problem Relation Age of Onset  . COPD Mother        stage 81  . Hypertension Mother 35  . Mitral valve prolapse Mother 73  . Crohn's disease Grandchild        granddaughter  . Mitral valve prolapse Sister 64  . Breast cancer Sister   . Colon cancer Father   . Cancer Paternal Aunt        bladder  . Yves Dill Parkinson White syndrome Paternal Uncle    . Breast cancer Maternal Grandmother   . [place] White syndrome Paternal Grandfather        ?  Marland Kitchen Yves Dill Parkinson White syndrome Cousin   . Yves Dill Parkinson White syndrome Cousin   . Cancer Paternal Aunt        melanoma  . Cancer Paternal Uncle        blood cancer  . Aneurysm Brother   . Stroke Brother   . AVM Brother   Sister was 24 with breast cancer (no genetic testing) died at 48, 2 first cousins with breast cancer (one paternal, one maternal). Both of her cousins had breast cancer in their 28's. PAunt with breast cancer in her 42's. MGM with breast cancer in her 90's.   Review of Systems  Genitourinary:       Vaginal dimness   All other systems reviewed and are negative.   Exam:   BP 128/66   Pulse 84   Ht 4' 11.5" (1.511 m)   Wt 165 lb (74.8 kg)   LMP 05/13/2013   SpO2 96%   BMI 32.77 kg/m   Weight change: @WEIGHTCHANGE @ Height:   Height: 4' 11.5" (151.1 cm)  Ht Readings from Last 3 Encounters:  06/16/20 4' 11.5" (1.511 m)  05/13/19 4' 11.75" (1.518 m)  04/17/18 4' 11.5" (1.511 m)    General appearance: alert, cooperative and appears stated age Head: Normocephalic, without obvious abnormality, atraumatic Breasts: normal appearance, no masses or tenderness Abdomen: soft, non-tender; non distended,  no masses,  no organomegaly Extremities: extremities normal, atraumatic, no cyanosis or edema Skin: Skin color, texture, turgor normal. No rashes or lesions Lymph nodes: Cervical, supraclavicular, and axillary nodes normal. No abnormal inguinal nodes palpated Neurologic: Grossly normal   Pelvic: External genitalia:  no lesions              Urethra:  normal appearing urethra with no masses, tenderness or lesions              Bartholins and Skenes: normal                 Vagina: atrophic appearing vagina with an increase in white, watery vaginal d/c              Cervix: no lesions               Bimanual Exam:  Uterus:  no masses  or tenderness               Adnexa: no mass, fullness, tenderness               Rectovaginal: Confirms               Anus:  normal sphincter tone, no lesions  Littie Deeds chaperoned for the exam.  Wet prep: ? clue, no trich, ++ wbc, +parabasilar cells KOH: no yeast PH: 5   A:  Gyn exam  Elevated risk of breast cancer, 29%  Vaginal d/c with odor, slides aren't clear  Vaginal atrophy  P:   No pap needed  Nuswab vaginitis panel sent  Discussed vulvar skin care, handout given  If swab + for BV will treat with metrogel, if negative she will try replens vaginal moisturizer.   Needs yearly breast exams  Mammogram UTD  Breast MRI in 10/21  Colonoscopy and DEXA UTD with her primary  In addition to the breast and pelvic exam over 20 minutes was spent in total patient care regarding abnormal vaginal discharge, discussing management

## 2020-06-16 ENCOUNTER — Ambulatory Visit (INDEPENDENT_AMBULATORY_CARE_PROVIDER_SITE_OTHER): Payer: Medicare Other | Admitting: Obstetrics and Gynecology

## 2020-06-16 ENCOUNTER — Encounter: Payer: Self-pay | Admitting: Obstetrics and Gynecology

## 2020-06-16 ENCOUNTER — Other Ambulatory Visit: Payer: Self-pay

## 2020-06-16 VITALS — BP 128/66 | HR 84 | Ht 59.5 in | Wt 165.0 lb

## 2020-06-16 DIAGNOSIS — Z9189 Other specified personal risk factors, not elsewhere classified: Secondary | ICD-10-CM | POA: Diagnosis not present

## 2020-06-16 DIAGNOSIS — Z01419 Encounter for gynecological examination (general) (routine) without abnormal findings: Secondary | ICD-10-CM

## 2020-06-16 DIAGNOSIS — Z800 Family history of malignant neoplasm of digestive organs: Secondary | ICD-10-CM

## 2020-06-16 DIAGNOSIS — Z803 Family history of malignant neoplasm of breast: Secondary | ICD-10-CM

## 2020-06-16 DIAGNOSIS — N898 Other specified noninflammatory disorders of vagina: Secondary | ICD-10-CM | POA: Diagnosis not present

## 2020-06-16 DIAGNOSIS — N952 Postmenopausal atrophic vaginitis: Secondary | ICD-10-CM | POA: Diagnosis not present

## 2020-06-16 NOTE — Patient Instructions (Signed)
EXERCISE AND DIET:  We recommended that you start or continue a regular exercise program for good health. Regular exercise means any activity that makes your heart beat faster and makes you sweat.  We recommend exercising at least 30 minutes per day at least 3 days a week, preferably 4 or 5.  We also recommend a diet low in fat and sugar.  Inactivity, poor dietary choices and obesity can cause diabetes, heart attack, stroke, and kidney damage, among others.    ALCOHOL AND SMOKING:  Women should limit their alcohol intake to no more than 7 drinks/beers/glasses of wine (combined, not each!) per week. Moderation of alcohol intake to this level decreases your risk of breast cancer and liver damage. And of course, no recreational drugs are part of a healthy lifestyle.  And absolutely no smoking or even second hand smoke. Most people know smoking can cause heart and lung diseases, but did you know it also contributes to weakening of your bones? Aging of your skin?  Yellowing of your teeth and nails?  CALCIUM AND VITAMIN D:  Adequate intake of calcium and Vitamin D are recommended.  The recommendations for exact amounts of these supplements seem to change often, but generally speaking 1,200 mg of calcium (between diet and supplement) and 800 units of Vitamin D per day seems prudent. Certain women may benefit from higher intake of Vitamin D.  If you are among these women, your doctor will have told you during your visit.    PAP SMEARS:  Pap smears, to check for cervical cancer or precancers,  have traditionally been done yearly, although recent scientific advances have shown that most women can have pap smears less often.  However, every woman still should have a physical exam from her gynecologist every year. It will include a breast check, inspection of the vulva and vagina to check for abnormal growths or skin changes, a visual exam of the cervix, and then an exam to evaluate the size and shape of the uterus and  ovaries.  And after 72 years of age, a rectal exam is indicated to check for rectal cancers. We will also provide age appropriate advice regarding health maintenance, like when you should have certain vaccines, screening for sexually transmitted diseases, bone density testing, colonoscopy, mammograms, etc.   MAMMOGRAMS:  All women over 40 years old should have a yearly mammogram. Many facilities now offer a "3D" mammogram, which may cost around $50 extra out of pocket. If possible,  we recommend you accept the option to have the 3D mammogram performed.  It both reduces the number of women who will be called back for extra views which then turn out to be normal, and it is better than the routine mammogram at detecting truly abnormal areas.    COLON CANCER SCREENING: Now recommend starting at age 45. At this time colonoscopy is not covered for routine screening until 50. There are take home tests that can be done between 45-49.   COLONOSCOPY:  Colonoscopy to screen for colon cancer is recommended for all women at age 50.  We know, you hate the idea of the prep.  We agree, BUT, having colon cancer and not knowing it is worse!!  Colon cancer so often starts as a polyp that can be seen and removed at colonscopy, which can quite literally save your life!  And if your first colonoscopy is normal and you have no family history of colon cancer, most women don't have to have it again for   10 years.  Once every ten years, you can do something that may end up saving your life, right?  We will be happy to help you get it scheduled when you are ready.  Be sure to check your insurance coverage so you understand how much it will cost.  It may be covered as a preventative service at no cost, but you should check your particular policy.      Breast Self-Awareness Breast self-awareness means being familiar with how your breasts look and feel. It involves checking your breasts regularly and reporting any changes to your  health care provider. Practicing breast self-awareness is important. A change in your breasts can be a sign of a serious medical problem. Being familiar with how your breasts look and feel allows you to find any problems early, when treatment is more likely to be successful. All women should practice breast self-awareness, including women who have had breast implants. How to do a breast self-exam One way to learn what is normal for your breasts and whether your breasts are changing is to do a breast self-exam. To do a breast self-exam: Look for Changes  1. Remove all the clothing above your waist. 2. Stand in front of a mirror in a room with good lighting. 3. Put your hands on your hips. 4. Push your hands firmly downward. 5. Compare your breasts in the mirror. Look for differences between them (asymmetry), such as: ? Differences in shape. ? Differences in size. ? Puckers, dips, and bumps in one breast and not the other. 6. Look at each breast for changes in your skin, such as: ? Redness. ? Scaly areas. 7. Look for changes in your nipples, such as: ? Discharge. ? Bleeding. ? Dimpling. ? Redness. ? A change in position. Feel for Changes Carefully feel your breasts for lumps and changes. It is best to do this while lying on your back on the floor and again while sitting or standing in the shower or tub with soapy water on your skin. Feel each breast in the following way:  Place the arm on the side of the breast you are examining above your head.  Feel your breast with the other hand.  Start in the nipple area and make  inch (2 cm) overlapping circles to feel your breast. Use the pads of your three middle fingers to do this. Apply light pressure, then medium pressure, then firm pressure. The light pressure will allow you to feel the tissue closest to the skin. The medium pressure will allow you to feel the tissue that is a little deeper. The firm pressure will allow you to feel the tissue  close to the ribs.  Continue the overlapping circles, moving downward over the breast until you feel your ribs below your breast.  Move one finger-width toward the center of the body. Continue to use the  inch (2 cm) overlapping circles to feel your breast as you move slowly up toward your collarbone.  Continue the up and down exam using all three pressures until you reach your armpit.  Write Down What You Find  Write down what is normal for each breast and any changes that you find. Keep a written record with breast changes or normal findings for each breast. By writing this information down, you do not need to depend only on memory for size, tenderness, or location. Write down where you are in your menstrual cycle, if you are still menstruating. If you are having trouble noticing differences   in your breasts, do not get discouraged. With time you will become more familiar with the variations in your breasts and more comfortable with the exam. How often should I examine my breasts? Examine your breasts every month. If you are breastfeeding, the best time to examine your breasts is after a feeding or after using a breast pump. If you menstruate, the best time to examine your breasts is 5-7 days after your period is over. During your period, your breasts are lumpier, and it may be more difficult to notice changes. When should I see my health care provider? See your health care provider if you notice:  A change in shape or size of your breasts or nipples.  A change in the skin of your breast or nipples, such as a reddened or scaly area.  Unusual discharge from your nipples.  A lump or thick area that was not there before.  Pain in your breasts.  Anything that concerns you.  Atrophic Vaginitis  Atrophic vaginitis is a condition in which the tissues that line the vagina become dry and thin. This condition is most common in women who have stopped having regular menstrual periods (are in  menopause). This usually starts when a woman is 45-55 years old. That is the time when a woman's estrogen levels begin to drop (decrease). Estrogen is a female hormone. It helps to keep the tissues of the vagina moist. It stimulates the vagina to produce a clear fluid that lubricates the vagina for sexual intercourse. This fluid also protects the vagina from infection. Lack of estrogen can cause the lining of the vagina to get thinner and dryer. The vagina may also shrink in size. It may become less elastic. Atrophic vaginitis tends to get worse over time as a woman's estrogen level drops. What are the causes? This condition is caused by the normal drop in estrogen that happens around the time of menopause. What increases the risk? Certain conditions or situations may lower a woman's estrogen level, leading to a higher risk for atrophic vaginitis. You are more likely to develop this condition if:  You are taking medicines that block estrogen.  You have had your ovaries removed.  You are being treated for cancer with X-ray (radiation) or medicines (chemotherapy).  You have given birth or are breastfeeding.  You are older than age 50.  You smoke. What are the signs or symptoms? Symptoms of this condition include:  Pain, soreness, or bleeding during sexual intercourse (dyspareunia).  Vaginal burning, irritation, or itching.  Pain or bleeding when a speculum is used in a vaginal exam (pelvic exam).  Having burning pain when passing urine.  Vaginal discharge that is brown or yellow. In some cases, there are no symptoms. How is this diagnosed? This condition is diagnosed by taking a medical history and doing a physical exam. This will include a pelvic exam that checks the vaginal tissues. Though rare, you may also have other tests, including:  A urine test.  A test that checks the acid balance in your vagina (acid balance test). How is this treated? Treatment for this condition  depends on how severe your symptoms are. Treatment may include:  Using an over-the-counter vaginal lubricant before sex.  Using a long-acting vaginal moisturizer.  Using low-dose vaginal estrogen for moderate to severe symptoms that do not respond to other treatments. Options include creams, tablets, and inserts (vaginal rings). Before you use a vaginal estrogen, tell your health care provider if you have a history of: ?   Breast cancer. ? Endometrial cancer. ? Blood clots. If you are not sexually active and your symptoms are very mild, you may not need treatment. Follow these instructions at home: Medicines  Take over-the-counter and prescription medicines only as told by your health care provider. Do not use herbal or alternative medicines unless your health care provider says that you can.  Use over-the-counter creams, lubricants, or moisturizers for dryness only as directed by your health care provider. General instructions  If your atrophic vaginitis is caused by menopause, discuss all of your menopause symptoms and treatment options with your health care provider.  Do not douche.  Do not use products that can make your vagina dry. These include: ? Scented feminine sprays. ? Scented tampons. ? Scented soaps.  Vaginal intercourse can help to improve blood flow and elasticity of vaginal tissue. If it hurts to have sex, try using a lubricant or moisturizer just before having intercourse. Contact a health care provider if:  Your discharge looks different than normal.  Your vagina has an unusual smell.  You have new symptoms.  Your symptoms do not improve with treatment.  Your symptoms get worse. Summary  Atrophic vaginitis is a condition in which the tissues that line the vagina become dry and thin. It is most common in women who have stopped having regular menstrual periods (are in menopause).  Treatment options include using vaginal lubricants and low-dose vaginal  estrogen.  Contact a health care provider if your vagina has an unusual smell, or if your symptoms get worse or do not improve after treatment. This information is not intended to replace advice given to you by your health care provider. Make sure you discuss any questions you have with your health care provider. Document Revised: 10/04/2017 Document Reviewed: 07/18/2017 Elsevier Patient Education  2020 [employer]  Vaginitis Vaginitis is a condition in which the vaginal tissue swells and becomes red (inflamed). This condition is most often caused by a change in the normal balance of bacteria and yeast that live in the vagina. This change causes an overgrowth of certain bacteria or yeast, which causes the inflammation. There are different types of vaginitis, but the most common types are:  Bacterial vaginosis.  Yeast infection (candidiasis).  Trichomoniasis vaginitis. This is a sexually transmitted disease (STD).  Viral vaginitis.  Atrophic vaginitis.  Allergic vaginitis. What are the causes? The cause of this condition depends on the type of vaginitis. It can be caused by:  Bacteria (bacterial vaginosis).  Yeast, which is a fungus (yeast infection).  A parasite (trichomoniasis vaginitis).  A virus (viral vaginitis).  Low hormone levels (atrophic vaginitis). Low hormone levels can occur during pregnancy, breastfeeding, or after menopause.  Irritants, such as bubble baths, scented tampons, and feminine sprays (allergic vaginitis). Other factors can change the normal balance of the yeast and bacteria that live in the vagina. These include:  Antibiotic medicines.  Poor hygiene.  Diaphragms, vaginal sponges, spermicides, birth control pills, and intrauterine devices (IUD).  Sex.  Infection.  Uncontrolled diabetes.  A weakened defense (immune) system. What increases the risk? This condition is more likely to develop in women who:  Smoke.  Use vaginal douches,  scented tampons, or scented sanitary pads.  Wear tight-fitting pants.  Wear thong underwear.  Use oral birth control pills or an IUD.  Have sex without a condom.  Have multiple sex partners.  Have an STD.  Frequently use the spermicide [id number]-[id number].  Eat lots of foods high in sugar.  Have uncontrolled   diabetes.  Have low estrogen levels.  Have a weakened immune system from an immune disorder or medical treatment.  Are pregnant or breastfeeding. What are the signs or symptoms? Symptoms vary depending on the cause of the vaginitis. Common symptoms include:  Abnormal vaginal discharge. ? The discharge is white, gray, or yellow with bacterial vaginosis. ? The discharge is thick, white, and cheesy with a yeast infection. ? The discharge is frothy and yellow or greenish with trichomoniasis.  A bad vaginal smell. The smell is fishy with bacterial vaginosis.  Vaginal itching, pain, or swelling.  Sex that is painful.  Pain or burning when urinating. Sometimes there are no symptoms. How is this diagnosed? This condition is diagnosed based on your symptoms and medical history. A physical exam, including a pelvic exam, will also be done. You may also have other tests, including:  Tests to determine the pH level (acidity or alkalinity) of your vagina.  A whiff test, to assess the odor that results when a sample of your vaginal discharge is mixed with a potassium hydroxide solution.  Tests of vaginal fluid. A sample will be examined under a microscope. How is this treated? Treatment varies depending on the type of vaginitis you have. Your treatment may include:  Antibiotic creams or pills to treat bacterial vaginosis and trichomoniasis.  Antifungal medicines, such as vaginal creams or suppositories, to treat a yeast infection.  Medicine to ease discomfort if you have viral vaginitis. Your sexual partner should also be treated.  Estrogen delivered in a cream, pill,  suppository, or vaginal ring to treat atrophic vaginitis. If vaginal dryness occurs, lubricants and moisturizing creams may help. You may need to avoid scented soaps, sprays, or douches.  Stopping use of a product that is causing allergic vaginitis. Then using a vaginal cream to treat the symptoms. Follow these instructions at home: Lifestyle  Keep your genital area clean and dry. Avoid soap, and only rinse the area with water.  Do not douche or use tampons until your health care provider says it is okay to do so. Use sanitary pads, if needed.  Do not have sex until your health care provider approves. When you can return to sex, practice safe sex and use condoms.  Wipe from front to back. This avoids the spread of bacteria from the rectum to the vagina. General instructions  Take over-the-counter and prescription medicines only as told by your health care provider.  If you were prescribed an antibiotic medicine, take or use it as told by your health care provider. Do not stop taking or using the antibiotic even if you start to feel better.  Keep all follow-up visits as told by your health care provider. This is important. How is this prevented?  Use mild, non-scented products. Do not use things that can irritate the vagina, such as fabric softeners. Avoid the following products if they are scented: ? Feminine sprays. ? Detergents. ? Tampons. ? Feminine hygiene products. ? Soaps or bubble baths.  Let air reach your genital area. ? Wear cotton underwear to reduce moisture buildup. ? Avoid wearing underwear while you sleep. ? Avoid wearing tight pants and underwear or nylons without a cotton panel. ? Avoid wearing thong underwear.  Take off any wet clothing, such as bathing suits, as soon as possible.  Practice safe sex and use condoms. Contact a health care provider if:  You have abdominal pain.  You have a fever.  You have symptoms that last for more than 2-3 days.   Get  help right away if:  You have a fever and your symptoms suddenly get worse. Summary  Vaginitis is a condition in which the vaginal tissue becomes inflamed.This condition is most often caused by a change in the normal balance of bacteria and yeast that live in the vagina.  Treatment varies depending on the type of vaginitis you have.  Do not douche, use tampons , or have sex until your health care provider approves. When you can return to sex, practice safe sex and use condoms. This information is not intended to replace advice given to you by your health care provider. Make sure you discuss any questions you have with your health care provider. Document Revised: 10/04/2017 Document Reviewed: 11/27/2016 Elsevier Patient Education  2020 [employer] Inc[employer]  

## 2020-06-17 ENCOUNTER — Encounter: Payer: Self-pay | Admitting: Genetic Counselor

## 2020-06-17 ENCOUNTER — Telehealth: Payer: Self-pay | Admitting: *Deleted

## 2020-06-17 DIAGNOSIS — Z9189 Other specified personal risk factors, not elsewhere classified: Secondary | ICD-10-CM

## 2020-06-17 DIAGNOSIS — Z1239 Encounter for other screening for malignant neoplasm of breast: Secondary | ICD-10-CM

## 2020-06-17 DIAGNOSIS — Z803 Family history of malignant neoplasm of breast: Secondary | ICD-10-CM

## 2020-06-17 NOTE — Telephone Encounter (Signed)
-----   Message from Salvadore Dom, MD sent at 06/16/2020  3:26 PM EDT ----- Please schedule for breast MR in 10/21. Thanks! Sharee Pimple

## 2020-06-17 NOTE — Telephone Encounter (Signed)
Order placed for screening bilateral breast MRI w/wo contrast at [facility].  29% lifetime risk Family hx breast cancer sister at age 72, MGM, paternal aunt and cousin x2  Call placed to patient, left detailed message on home number, ok per dpr.  Advised MRI order for [facility], to be scheduled in 08/2020. [facility] will contact you directly to schedule, once scheduled our office will precert. Contact the office if you have any additional questions.   Routing to [employer] for [employer].   Encounter closed.

## 2020-06-19 LAB — NUSWAB VAGINITIS (VG)
Candida albicans, NAA: NEGATIVE
Candida glabrata, NAA: NEGATIVE
Trich vag by NAA: NEGATIVE

## 2020-08-16 ENCOUNTER — Ambulatory Visit
Admission: RE | Admit: 2020-08-16 | Discharge: 2020-08-16 | Disposition: A | Discharge status: Home / Self Care | Payer: Medicare Other | Source: Ambulatory Visit | Attending: Obstetrics and Gynecology | Admitting: Obstetrics and Gynecology

## 2020-08-16 DIAGNOSIS — Z1239 Encounter for other screening for malignant neoplasm of breast: Secondary | ICD-10-CM

## 2020-08-16 DIAGNOSIS — Z9189 Other specified personal risk factors, not elsewhere classified: Secondary | ICD-10-CM

## 2020-08-16 DIAGNOSIS — Z803 Family history of malignant neoplasm of breast: Secondary | ICD-10-CM

## 2020-08-16 MED ORDER — GADOBUTROL 1 MMOL/ML IV SOLN
7.0000 mL | Freq: Once | INTRAVENOUS | Status: AC | PRN
  Administered 2020-08-16: 7 mL via INTRAVENOUS

## 2021-03-31 DIAGNOSIS — N1831 Chronic kidney disease, stage 3a: Secondary | ICD-10-CM | POA: Insufficient documentation

## 2021-03-31 DIAGNOSIS — Z8616 Personal history of COVID-19: Secondary | ICD-10-CM | POA: Insufficient documentation

## 2021-06-21 NOTE — Progress Notes (Signed)
75 y.o. G33P2002 Married White or Caucasian Not Hispanic or Latino female here for breast and pelvic exam.  No vaginal bleeding.   She has an elevated risk of breast cancer of 29% and has been getting yearly MRI's. She has had negative genetic testing.  She reports intermittent pain in her RLQ intermittently for over a year. It has happened 3-4 x in the last year. It occurred last year. The pain will last off and on for 1-2 days. It is initially sharp and then achy, feels like ovulatory pain. No diarrhea or constipation. Normal BM qd, no increase in gas.   H/O diverticulitis on the left.   Rare GSI.     Patient's last menstrual period was 05/13/2013.          Sexually active: No.  The current method of family planning is post menopausal status.    Exercising: No.  The patient does not participate in regular exercise at present. Smoker:  no  Health Maintenance: Pap:   04/17/18 Neg Hr Hpv Neg 04/13/16 WNL History of abnormal Pap:  no MMG:  ~2/22 normal per patient.  08/16/20 Breast MRI normal.  BMD: 01-11-20 managed by PCP Colonoscopy: 07-26-17 5 year f/u TDaP:  04-07-2019 Gardasil: n/a   reports that she has never smoked. She has never used smokeless tobacco. She reports that she does not drink alcohol and does not use drugs. 2 kids, 2 grandchildren, 3 great grandchildren. Everyone in [place].    Past Medical History:  Diagnosis Date   Arthritis    Breast adenoma 2010   benign   Cataract 3-14   Diverticulitis 2008   DVT (deep venous thrombosis) ([place])    oral contraceptive use   Fibroid    History of Rocky Mountain spotted fever 1990   History of uterine fibroid    Hypertension    Papilloma of breast 2010   breast nipple intra-ductal papilloma   Personal history of kidney stones    Shingles    Torn ligament    left ankle   Uterine polyp     Past Surgical History:  Procedure Laterality Date   APPENDECTOMY  1983   BREAST SURGERY Left 2010   adenoma removal, biopsy, tissue  removal; benign   CATARACT EXTRACTION W/ INTRAOCULAR LENS IMPLANT     CYSTECTOMY Right 1983   right ovary   DILATATION & CURETTAGE/HYSTEROSCOPY WITH TRUECLEAR N/A 06/24/2013   Procedure: [place];  Surgeon: Azalia Bilis, MD;  Location: [facility];  Service: Gynecology;  Laterality: N/A;   DILATION AND CURETTAGE OF UTERUS     HAND SURGERY     hysteroscopic resection     polyp removal     TUBAL LIGATION Bilateral 1983   VAGINAL DELIVERY  1969, 1973    Current Outpatient Medications  Medication Sig Dispense Refill   Calcium Carbonate-Vitamin D (CALCIUM 600 + D PO) Take 1 tablet by mouth daily.      Evolocumab 140 MG/ML SOSY Inject into the skin.     fluticasone (FLONASE) 50 MCG/ACT nasal spray Place 2 sprays into the nose daily as needed for rhinitis.     furosemide (LASIX) 20 MG tablet Take 20 mg by mouth 3 (three) times a week.      Garlic [id number] MG TBEC      losartan (COZAAR) 100 MG tablet Take 100 mg by mouth at bedtime.      metFORMIN (GLUCOPHAGE) 500 MG tablet Take 500 mg by mouth daily.  metoprolol succinate (TOPROL-XL) 25 MG 24 hr tablet Take 50 mg by mouth daily.      Multiple Vitamin (MULTIVITAMIN) tablet Take 1 tablet by mouth daily.     Naproxen Sodium 220 MG CAPS Take by mouth.     Omega-3 Fatty Acids (FISH OIL) 1000 MG CAPS Take 1,200 mg by mouth.     Polyvinyl Alcohol-Povidone (REFRESH OP) Apply to eye.     Probiotic Product (PROBIOTIC PO) Take by mouth daily.     Turmeric 500 MG CAPS      No current facility-administered medications for this visit.    Family History  Problem Relation Age of Onset   COPD Mother        stage 36   Hypertension Mother 76   Mitral valve prolapse Mother 33   Crohn's disease Grandchild        granddaughter   Mitral valve prolapse Sister 69   Breast cancer Sister    Colon cancer Father    Cancer Paternal Aunt        bladder   Yves Dill Parkinson White syndrome Paternal Uncle    Breast cancer  Maternal Grandmother    Scientist, research (medical) Parkinson White syndrome Paternal Grandfather        ?   Yves Dill Parkinson White syndrome Cousin    Scientist, research (medical) Parkinson White syndrome Cousin    Cancer Paternal Aunt        melanoma   Cancer Paternal Uncle        blood cancer   Aneurysm Brother    Stroke Brother    AVM Brother   Sister was 36 with breast cancer (no genetic testing) died at 18, 2 first cousins with breast cancer (one paternal, one maternal). Both of her cousins had breast cancer in their 13's. PAunt with breast cancer in her 54's. MGM with breast cancer in her 27's.   Review of Systems  Genitourinary:        Occasional RLQ pain   All other systems reviewed and are negative.  Exam:   BP (!) 142/70 (BP Location: Right Arm, Patient Position: Sitting, Cuff Size: Normal)   Pulse 80   Resp 12   Ht 5' (1.524 m)   Wt 165 lb (74.8 kg)   LMP 05/13/2013   BMI 32.22 kg/m   Weight change: '@WEIGHTCHANGE'$ @ Height:   Height: 5' (152.4 cm)  Ht Readings from Last 3 Encounters:  06/22/21 5' (1.524 m)  06/16/20 4' 11.5" (1.511 m)  05/13/19 4' 11.75" (1.518 m)    General appearance: alert, cooperative and appears stated age Head: Normocephalic, without obvious abnormality, atraumatic Neck: no adenopathy, supple, symmetrical, trachea midline and thyroid normal to inspection and palpation Lungs: clear to auscultation bilaterally Cardiovascular: regular rate and rhythm Breasts: normal appearance, no masses or tenderness Abdomen: soft, non-tender; non distended,  no masses,  no organomegaly Extremities: extremities normal, atraumatic, no cyanosis or edema Skin: Skin color, texture, turgor normal. No rashes or lesions Lymph nodes: Cervical, supraclavicular, and axillary nodes normal. No abnormal inguinal nodes palpated Neurologic: Grossly normal   Pelvic: External genitalia:  no lesions              Urethra:  normal appearing urethra with no masses, tenderness or lesions              Bartholins and  Skenes: normal                 Vagina: atrophic appearing vagina with normal color and discharge, no lesions  Cervix: no lesions               Bimanual Exam:  Uterus:   no masses or tenderness              Adnexa: no mass, fullness, tenderness               Rectovaginal: Confirms               Anus:  normal sphincter tone, no lesions  Karmen Bongo chaperoned for the exam.  1. Visit for gynecologic examination Discussed breast self exam Discussed calcium and vit D intake No pap this year Get a copy of her mammogram  2. Increased risk of breast cancer Set up breast MRI  3. Combined abdominal and pelvic pain Intermittent for the last year. No findings on exam Will return for gyn ultrasound.

## 2021-06-22 ENCOUNTER — Other Ambulatory Visit: Payer: Self-pay

## 2021-06-22 ENCOUNTER — Encounter: Payer: Self-pay | Admitting: Obstetrics and Gynecology

## 2021-06-22 ENCOUNTER — Ambulatory Visit (INDEPENDENT_AMBULATORY_CARE_PROVIDER_SITE_OTHER): Payer: Medicare Other | Admitting: Obstetrics and Gynecology

## 2021-06-22 VITALS — BP 142/70 | HR 80 | Resp 12 | Ht 60.0 in | Wt 165.0 lb

## 2021-06-22 DIAGNOSIS — Z9189 Other specified personal risk factors, not elsewhere classified: Secondary | ICD-10-CM | POA: Diagnosis not present

## 2021-06-22 DIAGNOSIS — R109 Unspecified abdominal pain: Secondary | ICD-10-CM | POA: Diagnosis not present

## 2021-06-22 DIAGNOSIS — Z01419 Encounter for gynecological examination (general) (routine) without abnormal findings: Secondary | ICD-10-CM | POA: Diagnosis not present

## 2021-06-22 DIAGNOSIS — R102 Pelvic and perineal pain: Secondary | ICD-10-CM | POA: Diagnosis not present

## 2021-06-22 NOTE — Patient Instructions (Signed)

## 2021-06-29 ENCOUNTER — Ambulatory Visit (INDEPENDENT_AMBULATORY_CARE_PROVIDER_SITE_OTHER): Payer: Medicare Other

## 2021-06-29 ENCOUNTER — Other Ambulatory Visit: Payer: Self-pay

## 2021-06-29 DIAGNOSIS — R102 Pelvic and perineal pain: Secondary | ICD-10-CM

## 2021-06-29 DIAGNOSIS — R109 Unspecified abdominal pain: Secondary | ICD-10-CM | POA: Diagnosis not present

## 2021-07-04 ENCOUNTER — Telehealth: Payer: Self-pay | Admitting: Obstetrics and Gynecology

## 2021-07-04 NOTE — Telephone Encounter (Signed)
Patient informed with below note. 

## 2021-07-04 NOTE — Telephone Encounter (Signed)
Please let the patient know that her ultrasound is only significant for 4 very small fibroids (all under a cm), normal ovaries bilaterally. Nothing on the ultrasound to explain her pain. She should f/u with her primary for further evaluation of her pain.

## 2021-07-21 ENCOUNTER — Telehealth: Payer: Self-pay | Admitting: *Deleted

## 2021-07-21 DIAGNOSIS — Z9189 Other specified personal risk factors, not elsewhere classified: Secondary | ICD-10-CM

## 2021-07-21 NOTE — Telephone Encounter (Signed)
-----   Message from Salvadore Dom, MD sent at 06/22/2021 11:57 AM EDT ----- Please schedule her breast MRI in 10/22, increased risk BC, 29%

## 2021-07-21 NOTE — Telephone Encounter (Addendum)
Referral placed. Patient aware  Once scheduled I will send to [facility] for prior approval

## 2021-07-25 NOTE — Telephone Encounter (Signed)
MRI appt 08/14/2021. Routing to [facility] for possible Prior approval

## 2021-08-14 ENCOUNTER — Ambulatory Visit
Admission: RE | Admit: 2021-08-14 | Discharge: 2021-08-14 | Disposition: A | Discharge status: Home / Self Care | Payer: Medicare Other | Source: Ambulatory Visit | Attending: Obstetrics and Gynecology | Admitting: Obstetrics and Gynecology

## 2021-08-14 ENCOUNTER — Other Ambulatory Visit: Payer: Self-pay

## 2021-08-14 DIAGNOSIS — Z9189 Other specified personal risk factors, not elsewhere classified: Secondary | ICD-10-CM

## 2021-08-14 MED ORDER — GADOBUTROL 1 MMOL/ML IV SOLN
7.0000 mL | Freq: Once | INTRAVENOUS | Status: AC | PRN
  Administered 2021-08-14: 7 mL via INTRAVENOUS

## 2021-10-14 DIAGNOSIS — R12 Heartburn: Secondary | ICD-10-CM | POA: Insufficient documentation

## 2021-10-14 DIAGNOSIS — A048 Other specified bacterial intestinal infections: Secondary | ICD-10-CM | POA: Insufficient documentation

## 2021-12-27 ENCOUNTER — Encounter: Payer: Self-pay | Admitting: Obstetrics and Gynecology

## 2022-04-14 DIAGNOSIS — E1169 Type 2 diabetes mellitus with other specified complication: Secondary | ICD-10-CM | POA: Insufficient documentation

## 2022-04-27 DIAGNOSIS — E139 Other specified diabetes mellitus without complications: Secondary | ICD-10-CM | POA: Insufficient documentation

## 2022-06-19 NOTE — Progress Notes (Signed)
76 y.o. G52P2002 Married White or Caucasian Not Hispanic or Latino female here for annual exam.     She has an elevated risk of breast cancer of 29% and has been getting yearly MRI's. She has had negative genetic testing.  She has a long h/o intermittent RLQ pain, no change. No concerning findings on U/S. BM's 2-3 x a day, no change.  Recent diagnosed with diabetes (from prediabetes), HgbA1C was 6.5, she is on medication.     Patient's last menstrual period was 06/18/2004.          Sexually active: No.  The current method of family planning is post menopausal status.    Exercising: Yes.  Hiking some.   Smoker:  no  Health Maintenance: Pap:   04/17/18 Neg Hr Hpv Neg 04/13/16 WNL History of abnormal Pap:  no MMG:  12/27/21 Bi-rads 1 neg  Breast MR 08/14/21 BMD: 01-11-20 managed by PCP Colonoscopy: 07-26-17 5 year f/u, scheduled in 2/24 TDaP:  04-07-2019 Gardasil: n/a   reports that she has never smoked. She has never used smokeless tobacco. She reports that she does not drink alcohol and does not use drugs. 2 kids, 2 grandchildren, 3 great grandchildren. Everyone in [place].      Past Medical History:  Diagnosis Date   Arthritis    Breast adenoma 2010   benign   Cataract 3-14   Diverticulitis 2008   DVT (deep venous thrombosis) ([place])    oral contraceptive use   Fibroid    History of Rocky Mountain spotted fever 1990   History of uterine fibroid    Hypertension    Papilloma of breast 2010   breast nipple intra-ductal papilloma   Personal history of kidney stones    Shingles    Torn ligament    left ankle   Uterine polyp     Past Surgical History:  Procedure Laterality Date   APPENDECTOMY  1983   BREAST SURGERY Left 2010   adenoma removal, biopsy, tissue removal; benign   CATARACT EXTRACTION W/ INTRAOCULAR LENS IMPLANT     CYSTECTOMY Right 1983   right ovary   DILATATION & CURETTAGE/HYSTEROSCOPY WITH TRUECLEAR N/A 06/24/2013   Procedure: [place];  Surgeon: Azalia Bilis, MD;  Location: [facility];  Service: Gynecology;  Laterality: N/A;   DILATION AND CURETTAGE OF UTERUS     HAND SURGERY     hysteroscopic resection     polyp removal     TUBAL LIGATION Bilateral 1983   VAGINAL DELIVERY  1969, 1973    Current Outpatient Medications  Medication Sig Dispense Refill   Calcium Carbonate-Vitamin D (CALCIUM 600 + D PO) Take 1 tablet by mouth daily.      Evolocumab 140 MG/ML SOSY Inject into the skin.     fluticasone (FLONASE) 50 MCG/ACT nasal spray Place 2 sprays into the nose daily as needed for rhinitis.     furosemide (LASIX) 20 MG tablet Take 20 mg by mouth 3 (three) times a week.      losartan (COZAAR) 100 MG tablet Take 100 mg by mouth at bedtime.      metFORMIN (GLUCOPHAGE) 500 MG tablet Take 500 mg by mouth daily.      metoprolol succinate (TOPROL-XL) 25 MG 24 hr tablet Take 50 mg by mouth daily.      Multiple Vitamin (MULTIVITAMIN) tablet Take 1 tablet by mouth daily.     Naproxen Sodium 220 MG CAPS Take by mouth.  Omega-3 Fatty Acids (FISH OIL) 1000 MG CAPS Take 1,200 mg by mouth.     Polyvinyl Alcohol-Povidone (REFRESH OP) Apply to eye.     Probiotic Product (PROBIOTIC PO) Take by mouth daily.     No current facility-administered medications for this visit.    Family History  Problem Relation Age of Onset   COPD Mother        stage 1   Hypertension Mother 36   Mitral valve prolapse Mother 68   Crohn's disease Grandchild        granddaughter   Mitral valve prolapse Sister 4   Breast cancer Sister    Colon cancer Father    Cancer Paternal Aunt        bladder   Yves Dill Parkinson White syndrome Paternal Uncle    Breast cancer Maternal Grandmother    Scientist, research (medical) Parkinson White syndrome Paternal Grandfather        ?   Yves Dill Parkinson White syndrome Cousin    Scientist, research (medical) Parkinson White syndrome Cousin    Cancer Paternal Aunt        melanoma   Cancer Paternal Uncle        blood cancer   Aneurysm Brother     Stroke Brother    AVM Brother   2 kids, 2 grandchildren, 3 great grandchildren. Everyone in [place].      Review of Systems  All other systems reviewed and are negative.   Exam:   BP 128/64   Pulse 72   Wt 165 lb (74.8 kg)   LMP 06/18/2004 Comment: has been evaluated for PMB several times  SpO2 99%   BMI 32.22 kg/m   Weight change: '@WEIGHTCHANGE'$ @ Height:      Ht Readings from Last 3 Encounters:  06/22/21 5' (1.524 m)  06/16/20 4' 11.5" (1.511 m)  05/13/19 4' 11.75" (1.518 m)    General appearance: alert, cooperative and appears stated age Head: Normocephalic, without obvious abnormality, atraumatic Neck: no adenopathy, supple, symmetrical, trachea midline and thyroid normal to inspection and palpation Breasts: normal appearance, no masses or tenderness Abdomen: soft, non-tender; non distended,  no masses,  no organomegaly Extremities: extremities normal, atraumatic, no cyanosis or edema Skin: Skin color, texture, turgor normal. No rashes or lesions Lymph nodes: Cervical, supraclavicular, and axillary nodes normal. No abnormal inguinal nodes palpated Neurologic: Grossly normal   Pelvic: External genitalia:  no lesions              Urethra:  normal appearing urethra with no masses, tenderness or lesions              Bartholins and Skenes: normal                 Vagina: normal appearing vagina with normal color and discharge, no lesions              Cervix: no lesions               Bimanual Exam:  Uterus:   no masses or tenderness              Adnexa: no mass, fullness, tenderness               Rectovaginal: Confirms               Anus:  normal sphincter tone, no lesions  Gae Dry chaperoned for the exam.   1. Visit for gynecologic examination Discussed breast self exam Discussed calcium and vit D intake Mammogram UTD Colonoscopy scheduled  DEXA with primary  2. Increased risk of breast cancer MRI due in 10/23, will schedule

## 2022-06-27 ENCOUNTER — Encounter: Payer: Self-pay | Admitting: Obstetrics and Gynecology

## 2022-06-27 ENCOUNTER — Ambulatory Visit (INDEPENDENT_AMBULATORY_CARE_PROVIDER_SITE_OTHER): Payer: Medicare Other | Admitting: Obstetrics and Gynecology

## 2022-06-27 ENCOUNTER — Telehealth: Payer: Self-pay

## 2022-06-27 VITALS — BP 128/64 | HR 72 | Wt 165.0 lb

## 2022-06-27 DIAGNOSIS — Z803 Family history of malignant neoplasm of breast: Secondary | ICD-10-CM

## 2022-06-27 DIAGNOSIS — Z9189 Other specified personal risk factors, not elsewhere classified: Secondary | ICD-10-CM

## 2022-06-27 DIAGNOSIS — Z01419 Encounter for gynecological examination (general) (routine) without abnormal findings: Secondary | ICD-10-CM

## 2022-06-27 NOTE — Patient Instructions (Signed)

## 2022-06-27 NOTE — Telephone Encounter (Signed)
-----   Message from Salvadore Dom, MD sent at 06/27/2022 10:58 AM EDT ----- She is due for a breast MRI in 10/23, please schedule. Thanks, Sharee Pimple

## 2022-08-23 NOTE — Telephone Encounter (Signed)
Scheduled for 08/28/22. I think you may have already authorized this but Im sending it to you just in case.

## 2022-08-28 ENCOUNTER — Ambulatory Visit
Admission: RE | Admit: 2022-08-28 | Discharge: 2022-08-28 | Disposition: A | Discharge status: Home / Self Care | Payer: Medicare Other | Source: Ambulatory Visit | Attending: Obstetrics and Gynecology | Admitting: Obstetrics and Gynecology

## 2022-08-28 DIAGNOSIS — Z9189 Other specified personal risk factors, not elsewhere classified: Secondary | ICD-10-CM

## 2022-08-28 DIAGNOSIS — Z803 Family history of malignant neoplasm of breast: Secondary | ICD-10-CM

## 2022-08-28 MED ORDER — GADOPICLENOL 0.5 MMOL/ML IV SOLN
6.0000 mL | Freq: Once | INTRAVENOUS | Status: AC | PRN
  Administered 2022-08-28: 6 mL via INTRAVENOUS

## 2022-08-31 NOTE — Telephone Encounter (Signed)
JJ sent pt mychart msg regarding results/recommendations. Will close this encounter.

## 2023-01-01 ENCOUNTER — Encounter: Payer: Self-pay | Admitting: Obstetrics and Gynecology

## 2023-07-03 ENCOUNTER — Ambulatory Visit: Payer: Medicare Other | Admitting: Obstetrics and Gynecology

## 2023-07-19 ENCOUNTER — Ambulatory Visit (INDEPENDENT_AMBULATORY_CARE_PROVIDER_SITE_OTHER): Payer: Medicare Other | Admitting: Radiology

## 2023-07-19 VITALS — BP 126/74 | HR 68 | Ht 59.5 in | Wt 163.8 lb

## 2023-07-19 DIAGNOSIS — Z01419 Encounter for gynecological examination (general) (routine) without abnormal findings: Secondary | ICD-10-CM

## 2023-07-19 DIAGNOSIS — N898 Other specified noninflammatory disorders of vagina: Secondary | ICD-10-CM | POA: Diagnosis not present

## 2023-07-19 DIAGNOSIS — Z9189 Other specified personal risk factors, not elsewhere classified: Secondary | ICD-10-CM

## 2023-07-19 LAB — WET PREP FOR TRICH, YEAST, CLUE

## 2023-07-19 NOTE — Progress Notes (Signed)
Marie Norris 1946/10/27 [id number]   History: Postmenopausal 77 y.o. presents for annual exam. C/o vaginal itching. Uses vaseline externally with some relief. Increased risk of breast cancer, has been having yearly MRIs. Last mammo normal.    Gynecologic History Postmenopausal Last Pap: 2019. Results were: normal Last mammogram: 12/31/22. Results were: normal Last colonoscopy: 2024 follow up 5 years DEXA:2023 managed by PCP   Obstetric History OB History  Gravida Para Term Preterm AB Living  2 2 2     2   SAB IAB Ectopic Multiple Live Births               # Outcome Date GA Lbr Len/2nd Weight Sex Type Anes PTL Lv  2 Term           1 Term              The following portions of the patient's history were reviewed and updated as appropriate: allergies, current medications, past family history, past medical history, past social history, past surgical history, and problem list.  Review of Systems Pertinent items noted in HPI and remainder of comprehensive ROS otherwise negative.  Past medical history, past surgical history, family history and social history were all reviewed and documented in the EPIC chart.  Exam:  Vitals:   07/19/23 1032  BP: 126/74  Pulse: 68  SpO2: 100%  Weight: 163 lb 12.8 oz (74.3 kg)  Height: 4' 11.5" (1.511 m)   Body mass index is 32.53 kg/m.  General appearance:  Normal Thyroid:  Symmetrical, normal in size, without palpable masses or nodularity. Respiratory  Auscultation:  Clear without wheezing or rhonchi Cardiovascular  Auscultation:  Regular rate, without rubs, murmurs or gallops  Edema/varicosities:  Not grossly evident Abdominal  Soft,nontender, without masses, guarding or rebound.  Liver/spleen:  No organomegaly noted  Hernia:  None appreciated  Skin  Inspection:  Grossly normal Breasts: Examined lying and sitting.   Right: Without masses, retractions, nipple discharge or axillary adenopathy.   Left: Without masses,  retractions, nipple discharge or axillary adenopathy. Genitourinary   Inguinal/mons:  Normal without inguinal adenopathy  External genitalia:  Normal appearing vulva with no masses, tenderness, or lesions  BUS/Urethra/Skene's glands:  Normal  Vagina:  Normal appearing with normal color and discharge, no lesions. Atrophy: moderate   Cervix:  Normal appearing without discharge or lesions  Uterus:  Normal in size, shape and contour.  Midline and mobile, nontender  Adnexa/parametria:     Rt: Normal in size, without masses or tenderness.   Lt: Normal in size, without masses or tenderness.  Anus and perineum: Normal    Marie Norris, CMA present for exam  Assessment/Plan:   1. Encounter for breast and pelvic examination May d/c paps  2. Increased risk of breast cancer TC risk 29% with dense breast tissue - MR BREAST BILATERAL W CONTRAST; Future  3. Vaginal itching Likely r/t dryness, recommended coconut oil internally and externally - WET PREP FOR TRICH, YEAST, CLUE; negative    Discussed SBE, colonoscopy and DEXA screening as directed. Recommend of exercise weekly, including weight bearing exercise. Encouraged the use of seatbelts and sunscreen.  Return in 1 year for annual or sooner prn.  [name] B WHNP-BC, 10:40 AM 07/19/2023

## 2023-08-16 ENCOUNTER — Ambulatory Visit
Admission: RE | Admit: 2023-08-16 | Discharge: 2023-08-16 | Disposition: A | Discharge status: Home / Self Care | Payer: Medicare Other | Source: Ambulatory Visit | Attending: Radiology | Admitting: Radiology

## 2023-08-16 DIAGNOSIS — Z9189 Other specified personal risk factors, not elsewhere classified: Secondary | ICD-10-CM

## 2023-08-16 MED ORDER — GADOPICLENOL 0.5 MMOL/ML IV SOLN
7.0000 mL | Freq: Once | INTRAVENOUS | Status: AC | PRN
  Administered 2023-08-16: 7 mL via INTRAVENOUS

## 2023-08-21 ENCOUNTER — Telehealth: Payer: Self-pay

## 2023-08-21 NOTE — Telephone Encounter (Signed)
Order faxed to [facility] for diagnostic mammogram w/ultrasound if necessary.

## 2023-09-02 ENCOUNTER — Encounter: Payer: Self-pay | Admitting: Radiology

## 2024-07-21 ENCOUNTER — Encounter: Payer: Self-pay | Admitting: Radiology

## 2024-07-21 ENCOUNTER — Ambulatory Visit (INDEPENDENT_AMBULATORY_CARE_PROVIDER_SITE_OTHER): Payer: Medicare Other | Admitting: Radiology

## 2024-07-21 VITALS — BP 138/82 | HR 72 | Ht 60.25 in | Wt 161.0 lb

## 2024-07-21 DIAGNOSIS — Z01419 Encounter for gynecological examination (general) (routine) without abnormal findings: Secondary | ICD-10-CM

## 2024-07-21 DIAGNOSIS — Z9189 Other specified personal risk factors, not elsewhere classified: Secondary | ICD-10-CM | POA: Diagnosis not present

## 2024-07-21 DIAGNOSIS — N898 Other specified noninflammatory disorders of vagina: Secondary | ICD-10-CM

## 2024-07-21 LAB — WET PREP FOR TRICH, YEAST, CLUE

## 2024-07-21 NOTE — Progress Notes (Signed)
   Marie Norris 05-21-1946 [id number]   History: Postmenopausal 78 y.o. presents for annual exam. C/o vaginal itching and d/c. Treated with monistat last month. Uses coconut oil with some relief. Increased risk of breast cancer, has been having yearly MRIs. Last MRI suspicious, f/u mammo normal. Had diverticulosis flare while on vacation, incidental finding of changes to the lung bases, has f/u scheduled.   Gynecologic History Postmenopausal Last Pap: 2019. Results were: normal Last mammogram: 10/24. Results were: normal MRI suspicious Last colonoscopy: 2024 follow up 5 years DEXA:2023 managed by PCP   Obstetric History OB History  Gravida Para Term Preterm AB Living  2 2 2   2   SAB IAB Ectopic Multiple Live Births          # Outcome Date GA Lbr Len/2nd Weight Sex Type Anes PTL Lv  2 Term           1 Term              The following portions of the patient's history were reviewed and updated as appropriate: allergies, current medications, past family history, past medical history, past social history, past surgical history, and problem list.  Review of Systems Pertinent items noted in HPI and remainder of comprehensive ROS otherwise negative.  Past medical history, past surgical history, family history and social history were all reviewed and documented in the EPIC chart.  Exam:  Vitals:   07/21/24 1017  BP: 138/82  Pulse: 72  SpO2: 99%  Weight: 161 lb (73 kg)  Height: 5' 0.25 (1.53 m)   Body mass index is 31.18 kg/m.  General appearance:  Normal Thyroid:  Symmetrical, normal in size, without palpable masses or nodularity. Respiratory  Auscultation:  Clear without wheezing or rhonchi Cardiovascular  Auscultation:  Regular rate, without rubs, murmurs or gallops  Edema/varicosities:  Not grossly evident Abdominal  Soft,nontender, without masses, guarding or rebound.  Liver/spleen:  No organomegaly noted  Hernia:  None appreciated  Skin  Inspection:  Grossly  normal Breasts: Examined lying and sitting.   Right: Without masses, retractions, nipple discharge or axillary adenopathy.   Left: Without masses, retractions, nipple discharge or axillary adenopathy. Genitourinary   Inguinal/mons:  Normal without inguinal adenopathy  External genitalia:  Normal appearing vulva with no masses, tenderness, or lesions  BUS/Urethra/Skene's glands:  Normal  Vagina:  Normal appearing with normal color. Scant thin discharge, no lesions. Atrophy: moderate   Cervix:  Normal appearing without discharge or lesions  Uterus:  Normal in size, shape and contour.  Midline and mobile, nontender  Adnexa/parametria:     Rt: Normal in size, without masses or tenderness.   Lt: Normal in size, without masses or tenderness.  Anus and perineum: Normal    Darice Hoit, CMA present for exam  Assessment/Plan:   1. Encounter for breast and pelvic examination (Primary) Paps d/c'd  2. Vaginal discharge - WET PREP FOR TRICH, YEAST, CLUE  3. Increased risk of breast cancer TC risk 29% with dense breast tissue - MR BREAST BILATERAL W WO CONTRAST INC CAD; Future    Return in 1 year for annual or sooner prn.  Giulio Bertino B WHNP-BC, 10:50 AM 07/21/2024

## 2024-08-09 ENCOUNTER — Ambulatory Visit
Admission: RE | Admit: 2024-08-09 | Discharge: 2024-08-09 | Disposition: A | Source: Ambulatory Visit | Attending: Radiology | Admitting: Radiology

## 2024-08-09 DIAGNOSIS — Z9189 Other specified personal risk factors, not elsewhere classified: Secondary | ICD-10-CM

## 2024-08-09 MED ORDER — GADOPICLENOL 0.5 MMOL/ML IV SOLN
7.0000 mL | Freq: Once | INTRAVENOUS | Status: AC | PRN
Start: 2024-08-09 — End: 2024-08-09
  Administered 2024-08-09: 7 mL via INTRAVENOUS

## 2025-07-23 ENCOUNTER — Encounter: Admitting: Radiology
